# Patient Record
Sex: Male | Born: 1965 | Race: White | Hispanic: No | Smoking: Former smoker
Health system: Southern US, Community
[De-identification: ages and names within clinical notes are randomized; demographics above are authoritative.]

## PROBLEM LIST (undated history)

## (undated) DIAGNOSIS — N200 Calculus of kidney: Secondary | ICD-10-CM

## (undated) DIAGNOSIS — M069 Rheumatoid arthritis, unspecified: Secondary | ICD-10-CM

## (undated) HISTORY — DX: Rheumatoid arthritis, unspecified: M06.9

## (undated) HISTORY — PX: LITHOTRIPSY: SUR834

---

## 2003-11-16 ENCOUNTER — Emergency Department (HOSPITAL_COMMUNITY): Admission: EM | Admit: 2003-11-16 | Discharge: 2003-11-16 | Payer: Self-pay | Admitting: Emergency Medicine

## 2003-12-17 ENCOUNTER — Emergency Department (HOSPITAL_COMMUNITY): Admission: EM | Admit: 2003-12-17 | Discharge: 2003-12-18 | Payer: Self-pay | Admitting: Emergency Medicine

## 2004-07-07 ENCOUNTER — Emergency Department (HOSPITAL_COMMUNITY): Admission: EM | Admit: 2004-07-07 | Discharge: 2004-07-07 | Payer: Self-pay | Admitting: Emergency Medicine

## 2004-10-04 ENCOUNTER — Emergency Department (HOSPITAL_COMMUNITY): Admission: AC | Admit: 2004-10-04 | Discharge: 2004-10-05 | Payer: Self-pay
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
## 2008-12-03 ENCOUNTER — Emergency Department (HOSPITAL_COMMUNITY): Admission: EM | Admit: 2008-12-03 | Discharge: 2008-12-03 | Payer: Self-pay | Admitting: Emergency Medicine

## 2009-12-26 ENCOUNTER — Emergency Department (HOSPITAL_COMMUNITY): Admission: EM | Admit: 2009-12-26 | Discharge: 2009-12-27 | Payer: Self-pay | Admitting: Emergency Medicine

## 2010-11-30 ENCOUNTER — Emergency Department (HOSPITAL_COMMUNITY)
Admission: EM | Admit: 2010-11-30 | Discharge: 2010-12-01 | Disposition: A | Discharge status: Home / Self Care | Payer: Self-pay | Attending: Emergency Medicine | Admitting: Emergency Medicine

## 2010-11-30 DIAGNOSIS — R05 Cough: Secondary | ICD-10-CM | POA: Insufficient documentation

## 2010-11-30 DIAGNOSIS — K089 Disorder of teeth and supporting structures, unspecified: Secondary | ICD-10-CM | POA: Insufficient documentation

## 2010-11-30 DIAGNOSIS — R51 Headache: Secondary | ICD-10-CM | POA: Insufficient documentation

## 2010-11-30 DIAGNOSIS — R059 Cough, unspecified: Secondary | ICD-10-CM | POA: Insufficient documentation

## 2010-11-30 DIAGNOSIS — J40 Bronchitis, not specified as acute or chronic: Secondary | ICD-10-CM | POA: Insufficient documentation

## 2010-11-30 DIAGNOSIS — K029 Dental caries, unspecified: Secondary | ICD-10-CM | POA: Insufficient documentation

## 2010-11-30 DIAGNOSIS — J3489 Other specified disorders of nose and nasal sinuses: Secondary | ICD-10-CM | POA: Insufficient documentation

## 2011-12-31 ENCOUNTER — Emergency Department (HOSPITAL_COMMUNITY): Payer: Self-pay

## 2011-12-31 ENCOUNTER — Inpatient Hospital Stay (HOSPITAL_COMMUNITY)
Admission: EM | Admit: 2011-12-31 | Discharge: 2012-01-02 | DRG: 418 | Disposition: A | Discharge status: Home / Self Care | Payer: Self-pay | Attending: General Surgery | Admitting: General Surgery

## 2011-12-31 ENCOUNTER — Encounter (HOSPITAL_COMMUNITY): Payer: Self-pay | Admitting: *Deleted

## 2011-12-31 DIAGNOSIS — K8000 Calculus of gallbladder with acute cholecystitis without obstruction: Principal | ICD-10-CM | POA: Diagnosis present

## 2011-12-31 DIAGNOSIS — F17200 Nicotine dependence, unspecified, uncomplicated: Secondary | ICD-10-CM | POA: Diagnosis present

## 2011-12-31 DIAGNOSIS — K819 Cholecystitis, unspecified: Secondary | ICD-10-CM

## 2011-12-31 DIAGNOSIS — K821 Hydrops of gallbladder: Secondary | ICD-10-CM | POA: Diagnosis present

## 2011-12-31 LAB — COMPREHENSIVE METABOLIC PANEL
BUN: 11 mg/dL (ref 6–23)
CO2: 27 mEq/L (ref 19–32)
Chloride: 98 mEq/L (ref 96–112)
Creatinine, Ser: 0.74 mg/dL (ref 0.50–1.35)
GFR calc non Af Amer: >90 mL/min (ref >90)
Total Bilirubin: 0.2 mg/dL — ABNORMAL LOW (ref 0.3–1.2)

## 2011-12-31 LAB — DIFFERENTIAL
Basophils Relative: 1 % (ref 0–1)
Lymphocytes Relative: 18 % (ref 12–46)
Monocytes Absolute: 1 10*3/uL (ref 0.1–1.0)
Monocytes Relative: 10 % (ref 3–12)
Neutro Abs: 7.2 10*3/uL (ref 1.7–7.7)

## 2011-12-31 LAB — CBC
HCT: 40.1 % (ref 39.0–52.0)
Hemoglobin: 13.7 g/dL (ref 13.0–17.0)
MCHC: 34.2 g/dL (ref 30.0–36.0)
MCV: 89.9 fL (ref 78.0–100.0)

## 2011-12-31 LAB — LIPASE, BLOOD: Lipase: 27 U/L (ref 11–59)

## 2011-12-31 MED ORDER — HYDROMORPHONE HCL PF 1 MG/ML IJ SOLN
1.0000 mg | Freq: Once | INTRAMUSCULAR | Status: AC
  Administered 2011-12-31: 1 mg via INTRAVENOUS
  Filled 2011-12-31: qty 1

## 2011-12-31 MED ORDER — ONDANSETRON HCL 4 MG/2ML IJ SOLN
4.0000 mg | Freq: Once | INTRAMUSCULAR | Status: AC
  Administered 2011-12-31: 4 mg via INTRAVENOUS
  Filled 2011-12-31: qty 2

## 2011-12-31 MED ORDER — HYDROMORPHONE HCL PF 1 MG/ML IJ SOLN
2.0000 mg | INTRAMUSCULAR | Status: DC | PRN
  Administered 2011-12-31 – 2012-01-02 (×12): 2 mg via INTRAVENOUS
  Filled 2011-12-31 (×12): qty 2

## 2011-12-31 MED ORDER — KCL IN DEXTROSE-NACL 20-5-0.45 MEQ/L-%-% IV SOLN
INTRAVENOUS | Status: DC
  Administered 2011-12-31 – 2012-01-01 (×2): via INTRAVENOUS

## 2011-12-31 MED ORDER — SODIUM CHLORIDE 0.9 % IJ SOLN
INTRAMUSCULAR | Status: AC
  Administered 2012-01-01: 10 mL
  Filled 2011-12-31: qty 3

## 2011-12-31 MED ORDER — SODIUM CHLORIDE 0.9 % IV SOLN: INTRAVENOUS | Status: DC

## 2011-12-31 MED ORDER — SODIUM CHLORIDE 0.9 % IV SOLN
Freq: Once | INTRAVENOUS | Status: AC
  Administered 2011-12-31: 14:00:00 via INTRAVENOUS

## 2011-12-31 MED ORDER — PANTOPRAZOLE SODIUM 40 MG IV SOLR
40.0000 mg | Freq: Once | INTRAVENOUS | Status: AC
  Administered 2011-12-31: 40 mg via INTRAVENOUS
  Filled 2011-12-31: qty 40

## 2011-12-31 MED ORDER — ENOXAPARIN SODIUM 40 MG/0.4ML ~~LOC~~ SOLN
40.0000 mg | SUBCUTANEOUS | Status: DC
  Administered 2011-12-31: 40 mg via SUBCUTANEOUS
  Filled 2011-12-31: qty 0.4

## 2011-12-31 MED ORDER — DIPHENHYDRAMINE HCL 12.5 MG/5ML PO ELIX: 12.5000 mg | ORAL_SOLUTION | Freq: Four times a day (QID) | ORAL | Status: DC | PRN

## 2011-12-31 MED ORDER — ONDANSETRON HCL 4 MG/2ML IJ SOLN
4.0000 mg | Freq: Four times a day (QID) | INTRAMUSCULAR | Status: DC | PRN
  Administered 2012-01-01: 4 mg via INTRAVENOUS
  Filled 2011-12-31: qty 2

## 2011-12-31 MED ORDER — DIPHENHYDRAMINE HCL 50 MG/ML IJ SOLN: 12.5000 mg | Freq: Four times a day (QID) | INTRAMUSCULAR | Status: DC | PRN

## 2011-12-31 NOTE — ED Notes (Signed)
Pain in right upper quadrant radiating into right rib cage

## 2011-12-31 NOTE — H&P (Signed)
Miguel Porter is an 46 y.o. male.   Chief Complaint: Right upper quadrant abdominal pain HPI: Patient is a 46 year old white male who presented to the emergency room earlier today with right upper quadrant abdominal pain, nausea, and generalized malaise. Ultrasound of gallbladder revealed cholecystitis with cholelithiasis. Common bile duct was within normal limits. This is his first episode. Denies any fever, chills, or jaundice.  History reviewed. No pertinent past medical history.  Past Surgical History  Procedure Date  . Lithotripsy     No family history on file. Social History:  reports that he has been smoking.  He does not have any smokeless tobacco history on file. He reports that he does not drink alcohol. His drug history not on file.  Allergies: No Known Allergies  Medications Prior to Admission  Medication Dose Route Frequency Provider Last Rate Last Dose  . 0.9 %  sodium chloride infusion   Intravenous Once [employer]. Colon Branch, MD 75 mL/hr at 12/31/11 1417    . dextrose 5 % and 0.45 % NaCl with KCl 20 mEq/L infusion   Intravenous Continuous Dalia Heading, MD      . diphenhydrAMINE (BENADRYL) injection 12.5-25 mg  12.5-25 mg Intravenous Q6H PRN Dalia Heading, MD       Or  . diphenhydrAMINE (BENADRYL) 12.5 MG/5ML elixir 12.5-25 mg  12.5-25 mg Oral Q6H PRN Dalia Heading, MD      . enoxaparin (LOVENOX) injection 40 mg  40 mg Subcutaneous Q24H Dalia Heading, MD      . HYDROmorphone (DILAUDID) injection 1 mg  1 mg Intravenous Once Nicoletta Dress. Colon Branch, MD   1 mg at 12/31/11 1417  . HYDROmorphone (DILAUDID) injection 1 mg  1 mg Intravenous Once [employer]. Colon Branch, MD   1 mg at 12/31/11 1718  . HYDROmorphone (DILAUDID) injection 1 mg  1 mg Intravenous Once [employer]. Colon Branch, MD   1 mg at 12/31/11 1830  . HYDROmorphone (DILAUDID) injection 2 mg  2 mg Intravenous Q3H PRN Dalia Heading, MD      . ondansetron [facility]) injection 4 mg  4 mg Intravenous Once Nicoletta Dress. Colon Branch, MD   4 mg at 12/31/11 1417   . ondansetron (ZOFRAN) injection 4 mg  4 mg Intravenous Q6H PRN Dalia Heading, MD      . pantoprazole (PROTONIX) injection 40 mg  40 mg Intravenous Once [employer]. Colon Branch, MD   40 mg at 12/31/11 1417  . DISCONTD: 0.9 %  sodium chloride infusion   Intravenous STAT Nicoletta Dress. Colon Branch, MD       No current outpatient prescriptions on file as of 12/31/2011.    Results for orders placed during the hospital encounter of 12/31/11 (from the past 48 hour(s))  CBC     Status: Normal   Collection Time   12/31/11  2:02 PM      Component Value Range Comment   WBC 10.5  4.0 - 10.5 (K/uL)    RBC 4.46  4.22 - 5.81 (MIL/uL)    Hemoglobin 13.7  13.0 - 17.0 (g/dL)    HCT 45.4  09.8 - 11.9 (%)    MCV 89.9  78.0 - 100.0 (fL)    MCH 30.7  26.0 - 34.0 (pg)    MCHC 34.2  30.0 - 36.0 (g/dL)    RDW 14.7  82.9 - 56.2 (%)    Platelets 229  150 - 400 (K/uL)   DIFFERENTIAL     Status: Normal   Collection  Time   12/31/11  2:02 PM      Component Value Range Comment   Neutrophils Relative 69  43 - 77 (%)    Neutro Abs 7.2  1.7 - 7.7 (K/uL)    Lymphocytes Relative 18  12 - 46 (%)    Lymphs Abs 1.9  0.7 - 4.0 (K/uL)    Monocytes Relative 10  3 - 12 (%)    Monocytes Absolute 1.0  0.1 - 1.0 (K/uL)    Eosinophils Relative 3  0 - 5 (%)    Eosinophils Absolute 0.3  0.0 - 0.7 (K/uL)    Basophils Relative 1  0 - 1 (%)    Basophils Absolute 0.1  0.0 - 0.1 (K/uL)   COMPREHENSIVE METABOLIC PANEL     Status: Abnormal   Collection Time   12/31/11  2:02 PM      Component Value Range Comment   Sodium 133 (*) 135 - 145 (mEq/L)    Potassium 3.9  3.5 - 5.1 (mEq/L)    Chloride 98  96 - 112 (mEq/L)    CO2 27  19 - 32 (mEq/L)    Glucose, Bld 132 (*) 70 - 99 (mg/dL)    BUN 11  6 - 23 (mg/dL)    Creatinine, Ser 1.61  0.50 - 1.35 (mg/dL)    Calcium 9.4  8.4 - 10.5 (mg/dL)    Total Protein 7.4  6.0 - 8.3 (g/dL)    Albumin 3.6  3.5 - 5.2 (g/dL)    AST 12  0 - 37 (U/L)    ALT 9  0 - 53 (U/L)    Alkaline Phosphatase 92  39 - 117  (U/L)    Total Bilirubin 0.2 (*) 0.3 - 1.2 (mg/dL)    GFR calc non Af Amer >90  >90 (mL/min)    GFR calc Af Amer >90  >90 (mL/min)   LIPASE, BLOOD     Status: Normal   Collection Time   12/31/11  2:02 PM      Component Value Range Comment   Lipase 27  11 - 59 (U/L)    Dg Abd Acute W/chest  12/31/2011  *RADIOLOGY REPORT*  Clinical Data: Epigastric and right upper quadrant pain  ACUTE ABDOMEN SERIES (ABDOMEN 2 VIEW & CHEST 1 VIEW)  Comparison: Chest radiograph 10/04/2004  Findings: Normal heart size, mediastinal contours, and pulmonary vascularity. Lungs clear. Minimally prominent stool in right colon from mid ascending colon through proximal transverse colon with a normal amount of stool throughout remainder of colon. Overall nonobstructive bowel gas pattern without bowel wall thickening or bowel dilatation. No free intraperitoneal air. Bones unremarkable. No urinary tract calcification.  IMPRESSION: No acute abnormalities.  Original Report Authenticated By: Lollie Marrow, M.D.   US Abdomen Limited Ruq  12/31/2011  *RADIOLOGY REPORT*  Clinical Data:  Right upper quadrant abdominal pain.  LIMITED ABDOMINAL ULTRASOUND - RIGHT UPPER QUADRANT  Comparison:  None.  Findings:  Gallbladder:  Multiple echogenic stones are present within the gallbladder.  Several of these stones are large, measuring up to 2.3 cm.  The gallbladder wall is thickened with edema.  It measures up to 4.2 mm.  A+ sonographic Murphy's sign is present.  Common bile duct:  The common bile duct is upper limits of normal at to 520 mm.  Liver:  Heterogeneous echotexture is noted, likely reflecting mild fatty infiltration.  No discrete lesions are evident.  Hepatopetal flow is present in the portal vein.  IMPRESSION:  1.  Findings  compatible with cholecystitis. 2.  Probable fatty infiltration of the liver.                   Original Report Authenticated By: Jamesetta Orleans. MATTERN, M.D.    Review of Systems  Constitutional: Positive for  fever and malaise/fatigue.  HENT: Negative.   Eyes: Negative.   Respiratory: Negative.   Cardiovascular: Negative.   Gastrointestinal: Positive for nausea and abdominal pain.  Genitourinary: Negative.   Musculoskeletal: Negative.   Skin: Negative.   Neurological: Negative.   Endo/Heme/Allergies: Negative.   Psychiatric/Behavioral: Negative.     Blood pressure 133/96, pulse 89, temperature 98.4 F (36.9 C), temperature source Oral, resp. rate 20, height 6' (1.829 m), weight 98.884 kg (218 lb), SpO2 92.00%. Physical Exam  Constitutional: He is oriented to person, place, and time. He appears well-developed and well-nourished.  HENT:  Head: Normocephalic.  Eyes: Pupils are equal, round, and reactive to light. No scleral icterus.  Neck: Normal range of motion. Neck supple.  Cardiovascular: Normal rate, regular rhythm and normal heart sounds.   Respiratory: Effort normal and breath sounds normal.  GI: Soft. Bowel sounds are normal. There is tenderness. There is guarding.       Rapid quadrant abdominal pain to palpation. No rigidity noted.  Neurological: He is alert and oriented to person, place, and time.  Skin: Skin is warm and dry.  Psychiatric: He has a normal mood and affect. His behavior is normal. Judgment and thought content normal.     Assessment/Plan Impression: Cholecystitis, cholelithiasis Plan: Patient will be admitted the hospital for intravenous hydration and pain control. He was subsequently undergo a laparoscopic cholecystectomy. The risks and benefits of the procedure including bleeding, infection, hepatobiliary, the possibly of an open procedure were fully explained to the patient, gave informed consent.  Renny Gunnarson A 12/31/2011, 8:55 PM

## 2011-12-31 NOTE — ED Notes (Signed)
Pt watching tv. Rates pain 5/10. States he would like to have some more pain meds if possible. edp aware

## 2011-12-31 NOTE — ED Notes (Signed)
Per Kem Kays, RN pt's room will be ready in 10 min.

## 2011-12-31 NOTE — ED Provider Notes (Addendum)
History     CSN: [id number]  Arrival date & time 12/31/11  1255   First MD Initiated Contact with Patient 12/31/11 1308      Chief Complaint  Patient presents with  . Abdominal Pain    (Consider location/radiation/quality/duration/timing/severity/associated sxs/prior treatment) HPI Miguel Porter is a 46 y.o. male who presents to the Emergency Department complaining of sharp abdominal pain that began this morning and is located in the epigastric area and RUQ. He still has his gallbladder. He describes pain as constant with intermittent episodes of more intense pain. He ate a small bowl of oatmeal at 6:30 AM. Has had continuous pain all day. Has had mild nausea but no vomiting. Last bowel movement was yesterday and less than normal. Patient states he has a history of constipation.   History reviewed. No pertinent past medical history.  Past Surgical History  Procedure Date  . Lithotripsy     No family history on file.  History  Substance Use Topics  . Smoking status: Current Everyday Smoker -- 0.5 packs/day  . Smokeless tobacco: Not on file  . Alcohol Use: No      Review of Systems ROS: Statement: All systems negative except as marked or noted in the HPI; Constitutional: Negative for fever and chills. ; ; Eyes: Negative for eye pain, redness and discharge. ; ; ENMT: Negative for ear pain, hoarseness, nasal congestion, sinus pressure and sore throat. ; ; Cardiovascular: Negative for chest pain, palpitations, diaphoresis, dyspnea and peripheral edema. ; ; Respiratory: Negative for cough, wheezing and stridor. ; ; Gastrointestinal: Negative for  vomiting, diarrhea,  blood in stool, hematemesis, jaundice and rectal bleeding. . ; ; Genitourinary: Negative for dysuria, flank pain and hematuria. ; ; Musculoskeletal: Negative for back pain and neck pain. Negative for swelling and trauma.; ; Skin: Negative for pruritus, rash, abrasions, blisters, bruising and skin lesion.; ; Neuro: Negative  for headache, lightheadedness and neck stiffness. Negative for weakness, altered level of consciousness , altered mental status, extremity weakness, paresthesias, involuntary movement, seizure and syncope.     Allergies  Review of patient's allergies indicates no known allergies.  Home Medications  No current outpatient prescriptions on file.  BP 162/106  Pulse 81  Temp(Src) 97.7 F (36.5 C) (Oral)  Resp 20  Ht 6' (1.829 m)  Wt 218 lb (98.884 kg)  BMI 29.57 kg/m2  SpO2 98%  Physical Exam Physical examination:  Nursing notes reviewed; Vital signs and O2 SAT reviewed;  Constitutional: Well developed, Well nourished, Well hydrated, In no acute distress; Head:  Normocephalic, atraumatic; Eyes: EOMI, PERRL, No scleral icterus; ENMT: Mouth and pharynx normal, Mucous membranes moist; Neck: Supple, Full range of motion, No lymphadenopathy; Cardiovascular: Regular rate and rhythm, No murmur, rub, or gallop; Respiratory: Breath sounds clear & equal bilaterally, No rales, rhonchi, wheezes, or rub, Normal respiratory effort/excursion; Chest: Nontender, Movement normal; Abdomen: Soft, tenderness to the epigastric area and RUQ, Nondistended, Normal bowel sounds; Genitourinary: No CVA tenderness; Extremities: Pulses normal, No tenderness, No edema, No calf edema or asymmetry.; Neuro: AA&Ox3, Major CN grossly intact.  No gross focal motor or sensory deficits in extremities.; Skin: Color normal, Warm, Dry  ED Course  Procedures (including critical care time)  Results for orders placed during the hospital encounter of 12/31/11  CBC      Component Value Range   WBC 10.5  4.0 - 10.5 (K/uL)   RBC 4.46  4.22 - 5.81 (MIL/uL)   Hemoglobin 13.7  13.0 - 17.0 (g/dL)  HCT 40.1  39.0 - 52.0 (%)   MCV 89.9  78.0 - 100.0 (fL)   MCH 30.7  26.0 - 34.0 (pg)   MCHC 34.2  30.0 - 36.0 (g/dL)   RDW 91.4  78.2 - 95.6 (%)   Platelets 229  150 - 400 (K/uL)  DIFFERENTIAL      Component Value Range   Neutrophils  Relative 69  43 - 77 (%)   Neutro Abs 7.2  1.7 - 7.7 (K/uL)   Lymphocytes Relative 18  12 - 46 (%)   Lymphs Abs 1.9  0.7 - 4.0 (K/uL)   Monocytes Relative 10  3 - 12 (%)   Monocytes Absolute 1.0  0.1 - 1.0 (K/uL)   Eosinophils Relative 3  0 - 5 (%)   Eosinophils Absolute 0.3  0.0 - 0.7 (K/uL)   Basophils Relative 1  0 - 1 (%)   Basophils Absolute 0.1  0.0 - 0.1 (K/uL)  COMPREHENSIVE METABOLIC PANEL      Component Value Range   Sodium 133 (*) 135 - 145 (mEq/L)   Potassium 3.9  3.5 - 5.1 (mEq/L)   Chloride 98  96 - 112 (mEq/L)   CO2 27  19 - 32 (mEq/L)   Glucose, Bld 132 (*) 70 - 99 (mg/dL)   BUN 11  6 - 23 (mg/dL)   Creatinine, Ser 2.13  0.50 - 1.35 (mg/dL)   Calcium 9.4  8.4 - 08.6 (mg/dL)   Total Protein 7.4  6.0 - 8.3 (g/dL)   Albumin 3.6  3.5 - 5.2 (g/dL)   AST 12  0 - 37 (U/L)   ALT 9  0 - 53 (U/L)   Alkaline Phosphatase 92  39 - 117 (U/L)   Total Bilirubin 0.2 (*) 0.3 - 1.2 (mg/dL)   GFR calc non Af Amer >90  >90 (mL/min)   GFR calc Af Amer >90  >90 (mL/min)  LIPASE, BLOOD      Component Value Range   Lipase 27  11 - 59 (U/L)     Date: 12/31/2011  1304  Rate:86  Rhythm: normal sinus rhythm  QRS Axis: normal  Intervals: normal  ST/T Wave abnormalities: normal  Conduction Disutrbances: none  Narrative Interpretation: unremarkable  Dg Abd Acute W/chest  12/31/2011  *RADIOLOGY REPORT*  Clinical Data: Epigastric and right upper quadrant pain  ACUTE ABDOMEN SERIES (ABDOMEN 2 VIEW & CHEST 1 VIEW)  Comparison: Chest radiograph 10/04/2004  Findings: Normal heart size, mediastinal contours, and pulmonary vascularity. Lungs clear. Minimally prominent stool in right colon from mid ascending colon through proximal transverse colon with a normal amount of stool throughout remainder of colon. Overall nonobstructive bowel gas pattern without bowel wall thickening or bowel dilatation. No free intraperitoneal air. Bones unremarkable. No urinary tract calcification.  IMPRESSION: No acute  abnormalities.  Original Report Authenticated By: Lollie Marrow, M.D.   US Abdomen Limited Ruq  12/31/2011  *RADIOLOGY REPORT*  Clinical Data:  Right upper quadrant abdominal pain.  LIMITED ABDOMINAL ULTRASOUND - RIGHT UPPER QUADRANT  Comparison:  None.  Findings:  Gallbladder:  Multiple echogenic stones are present within the gallbladder.  Several of these stones are large, measuring up to 2.3 cm.  The gallbladder wall is thickened with edema.  It measures up to 4.2 mm.  A+ sonographic Murphy's sign is present.  Common bile duct:  The common bile duct is upper limits of normal at to 520 mm.  Liver:  Heterogeneous echotexture is noted, likely reflecting mild fatty infiltration.  No discrete lesions are evident.  Hepatopetal flow is present in the portal vein.  IMPRESSION:  1.  Findings compatible with cholecystitis. 2.  Probable fatty infiltration of the liver.                   Original Report Authenticated By: Jamesetta Orleans. MATTERN, M.D.    Date: 12/31/2011  12/31/2011  Rate:75  Rhythm: normal sinus rhythm  QRS Axis: normal  Intervals: normal  ST/T Wave abnormalities: normal  Conduction Disutrbances: none  Narrative Interpretation: unremarkable  0512  T/C to Dr. Lovell Sheehan,  General surgeon. Case discussed, including:  HPI, pertinent PM/SHx, VS/PE, dx testing, ED course and treatment.  Agreeable to admission.  Requests to write temporary orders, med surg bed.   MDM  Patient with right upper quadrant and epigastric pain since this morning. Labs are normal. Abdomen series is unremarkable. Ultrasound with findings compatible with cholecystitis. The general surgeon, Dr. Lovell Sheehan. He would admit the patient for cholecystectomy in the morning.Pt stable in ED with no significant deterioration in condition.The patient appears reasonably stabilized for admission considering the current resources, flow, and capabilities available in the ED at this time, and I doubt any other [facility] requiring further screening  and/or treatment in the ED prior to admission.  MDM Reviewed: nursing note and vitals Interpretation: labs, ECG, x-ray and ultrasound           Nicoletta Dress. Colon Branch, MD 12/31/11 1756  Nicoletta Dress. Colon Branch, MD 12/31/11 2127

## 2012-01-01 ENCOUNTER — Encounter (HOSPITAL_COMMUNITY): Payer: Self-pay | Admitting: *Deleted

## 2012-01-01 ENCOUNTER — Encounter (HOSPITAL_COMMUNITY)
Admission: EM | Disposition: A | Discharge status: Home / Self Care | Payer: Self-pay | Source: Home / Self Care | Attending: General Surgery

## 2012-01-01 ENCOUNTER — Encounter (HOSPITAL_COMMUNITY): Payer: Self-pay | Admitting: Anesthesiology

## 2012-01-01 ENCOUNTER — Inpatient Hospital Stay (HOSPITAL_COMMUNITY): Payer: Self-pay | Admitting: Anesthesiology

## 2012-01-01 HISTORY — PX: CHOLECYSTECTOMY: SHX55

## 2012-01-01 LAB — SURGICAL PCR SCREEN: Staphylococcus aureus: NEGATIVE

## 2012-01-01 SURGERY — LAPAROSCOPIC CHOLECYSTECTOMY
Anesthesia: General | Site: Abdomen | Wound class: Contaminated

## 2012-01-01 MED ORDER — ENOXAPARIN SODIUM 40 MG/0.4ML ~~LOC~~ SOLN
40.0000 mg | Freq: Once | SUBCUTANEOUS | Status: AC
  Administered 2012-01-01: 40 mg via SUBCUTANEOUS

## 2012-01-01 MED ORDER — FENTANYL CITRATE 0.05 MG/ML IJ SOLN
INTRAMUSCULAR | Status: AC
  Administered 2012-01-01: 50 ug via INTRAVENOUS
  Filled 2012-01-01: qty 2

## 2012-01-01 MED ORDER — GLYCOPYRROLATE 0.2 MG/ML IJ SOLN
INTRAMUSCULAR | Status: DC | PRN
  Administered 2012-01-01: 0.4 mg via INTRAVENOUS

## 2012-01-01 MED ORDER — ACETAMINOPHEN 10 MG/ML IV SOLN
1000.0000 mg | Freq: Four times a day (QID) | INTRAVENOUS | Status: DC
  Administered 2012-01-01 – 2012-01-02 (×3): 1000 mg via INTRAVENOUS
  Filled 2012-01-01 (×3): qty 100

## 2012-01-01 MED ORDER — ROCURONIUM BROMIDE 50 MG/5ML IV SOLN
INTRAVENOUS | Status: AC
  Filled 2012-01-01: qty 1

## 2012-01-01 MED ORDER — FENTANYL CITRATE 0.05 MG/ML IJ SOLN
INTRAMUSCULAR | Status: AC
  Administered 2012-01-01: 50 ug via INTRAVENOUS
  Filled 2012-01-01: qty 5

## 2012-01-01 MED ORDER — GLYCOPYRROLATE 0.2 MG/ML IJ SOLN
INTRAMUSCULAR | Status: AC
  Administered 2012-01-01: 0.2 mg via INTRAVENOUS
  Filled 2012-01-01: qty 1

## 2012-01-01 MED ORDER — ONDANSETRON HCL 4 MG/2ML IJ SOLN
4.0000 mg | Freq: Once | INTRAMUSCULAR | Status: AC
  Administered 2012-01-01: 4 mg via INTRAVENOUS

## 2012-01-01 MED ORDER — MIDAZOLAM HCL 2 MG/2ML IJ SOLN
INTRAMUSCULAR | Status: AC
  Administered 2012-01-01: 2 mg via INTRAVENOUS
  Filled 2012-01-01: qty 2

## 2012-01-01 MED ORDER — ENOXAPARIN SODIUM 40 MG/0.4ML ~~LOC~~ SOLN
SUBCUTANEOUS | Status: AC
  Filled 2012-01-01: qty 0.4

## 2012-01-01 MED ORDER — GLYCOPYRROLATE 0.2 MG/ML IJ SOLN
0.2000 mg | Freq: Once | INTRAMUSCULAR | Status: AC
  Administered 2012-01-01: 0.2 mg via INTRAVENOUS

## 2012-01-01 MED ORDER — GLYCOPYRROLATE 0.2 MG/ML IJ SOLN
INTRAMUSCULAR | Status: AC
  Filled 2012-01-01: qty 1

## 2012-01-01 MED ORDER — SODIUM CHLORIDE 0.9 % IV SOLN
3.0000 g | Freq: Four times a day (QID) | INTRAVENOUS | Status: AC
  Administered 2012-01-01 – 2012-01-02 (×2): 3 g via INTRAVENOUS
  Filled 2012-01-01 (×3): qty 3

## 2012-01-01 MED ORDER — MIDAZOLAM HCL 2 MG/2ML IJ SOLN
INTRAMUSCULAR | Status: AC
  Filled 2012-01-01: qty 2

## 2012-01-01 MED ORDER — FENTANYL CITRATE 0.05 MG/ML IJ SOLN
INTRAMUSCULAR | Status: DC | PRN
  Administered 2012-01-01: 50 ug via INTRAVENOUS
  Administered 2012-01-01: 100 ug via INTRAVENOUS
  Administered 2012-01-01 (×2): 50 ug via INTRAVENOUS

## 2012-01-01 MED ORDER — ACETAMINOPHEN 325 MG PO TABS: 325.0000 mg | ORAL_TABLET | ORAL | Status: DC | PRN

## 2012-01-01 MED ORDER — SODIUM CHLORIDE 0.9 % IR SOLN
Status: DC | PRN
  Administered 2012-01-01: 3000 mL

## 2012-01-01 MED ORDER — MIDAZOLAM HCL 2 MG/2ML IJ SOLN
1.0000 mg | INTRAMUSCULAR | Status: DC | PRN
  Administered 2012-01-01 (×2): 2 mg via INTRAVENOUS

## 2012-01-01 MED ORDER — ACETAMINOPHEN 10 MG/ML IV SOLN
INTRAVENOUS | Status: AC
  Administered 2012-01-01: 1000 mg
  Filled 2012-01-01: qty 100

## 2012-01-01 MED ORDER — SODIUM CHLORIDE 0.9 % IR SOLN
Status: DC | PRN
  Administered 2012-01-01: 1000 mL

## 2012-01-01 MED ORDER — PROPOFOL 10 MG/ML IV EMUL
INTRAVENOUS | Status: DC | PRN
  Administered 2012-01-01: 150 mg via INTRAVENOUS
  Administered 2012-01-01: 50 mg via INTRAVENOUS

## 2012-01-01 MED ORDER — LIDOCAINE HCL (CARDIAC) 10 MG/ML IV SOLN
INTRAVENOUS | Status: DC | PRN
  Administered 2012-01-01: 10 mg via INTRAVENOUS

## 2012-01-01 MED ORDER — CEFAZOLIN SODIUM-DEXTROSE 2-3 GM-% IV SOLR
INTRAVENOUS | Status: AC
  Filled 2012-01-01: qty 50

## 2012-01-01 MED ORDER — FENTANYL CITRATE 0.05 MG/ML IJ SOLN
25.0000 ug | INTRAMUSCULAR | Status: DC | PRN
  Administered 2012-01-01 (×4): 50 ug via INTRAVENOUS

## 2012-01-01 MED ORDER — NEOSTIGMINE METHYLSULFATE 1 MG/ML IJ SOLN
INTRAMUSCULAR | Status: DC | PRN
  Administered 2012-01-01: 2 mg via INTRAVENOUS

## 2012-01-01 MED ORDER — CEFAZOLIN SODIUM-DEXTROSE 2-3 GM-% IV SOLR
2.0000 g | INTRAVENOUS | Status: AC
  Administered 2012-01-01: 2 g via INTRAVENOUS

## 2012-01-01 MED ORDER — ONDANSETRON HCL 4 MG/2ML IJ SOLN
INTRAMUSCULAR | Status: AC
  Filled 2012-01-01: qty 2

## 2012-01-01 MED ORDER — BUPIVACAINE HCL 0.5 % IJ SOLN
INTRAMUSCULAR | Status: DC | PRN
  Administered 2012-01-01: 15 mL

## 2012-01-01 MED ORDER — ROCURONIUM BROMIDE 100 MG/10ML IV SOLN
INTRAVENOUS | Status: DC | PRN
  Administered 2012-01-01: 30 mg via INTRAVENOUS
  Administered 2012-01-01: 5 mg via INTRAVENOUS
  Administered 2012-01-01: 10 mg via INTRAVENOUS

## 2012-01-01 MED ORDER — SODIUM CHLORIDE 0.9 % IJ SOLN
INTRAMUSCULAR | Status: AC
  Administered 2012-01-01: 10 mL
  Filled 2012-01-01: qty 3

## 2012-01-01 MED ORDER — ONDANSETRON HCL 4 MG/2ML IJ SOLN: 4.0000 mg | Freq: Once | INTRAMUSCULAR | Status: DC | PRN

## 2012-01-01 MED ORDER — PROPOFOL 10 MG/ML IV EMUL
INTRAVENOUS | Status: AC
  Filled 2012-01-01: qty 20

## 2012-01-01 MED ORDER — BUPIVACAINE HCL (PF) 0.5 % IJ SOLN
INTRAMUSCULAR | Status: AC
  Filled 2012-01-01: qty 30

## 2012-01-01 MED ORDER — LACTATED RINGERS IV SOLN
INTRAVENOUS | Status: DC
  Administered 2012-01-01: 1000 mL via INTRAVENOUS

## 2012-01-01 MED ORDER — LACTATED RINGERS IV SOLN
INTRAVENOUS | Status: DC
  Administered 2012-01-02: 03:00:00 via INTRAVENOUS

## 2012-01-01 MED ORDER — HEMOSTATIC AGENTS (NO CHARGE) OPTIME
TOPICAL | Status: DC | PRN
  Administered 2012-01-01: 1 via TOPICAL

## 2012-01-01 SURGICAL SUPPLY — 36 items
APPLIER CLIP ROT 10 11.4 M/L (STAPLE) ×2
APR CLP MED LRG 11.4X10 (STAPLE) ×1
BAG HAMPER (MISCELLANEOUS) ×2 IMPLANT
BAG SPEC RTRVL LRG 6X4 10 (ENDOMECHANICALS) ×1
CLIP APPLIE ROT 10 11.4 M/L (STAPLE) ×1 IMPLANT
CLOTH BEACON ORANGE TIMEOUT ST (SAFETY) ×2 IMPLANT
COVER SURGICAL LIGHT HANDLE (MISCELLANEOUS) ×4 IMPLANT
CUTTER LINEAR ENDO 35 ETS TH (STAPLE) ×1 IMPLANT
DECANTER SPIKE VIAL GLASS SM (MISCELLANEOUS) ×2 IMPLANT
DURAPREP 26ML APPLICATOR (WOUND CARE) ×2 IMPLANT
ELECT REM PT RETURN 9FT ADLT (ELECTROSURGICAL) ×2
ELECTRODE REM PT RTRN 9FT ADLT (ELECTROSURGICAL) ×1 IMPLANT
FILTER SMOKE EVAC LAPAROSHD (FILTER) ×2 IMPLANT
FORMALIN 10 PREFIL 120ML (MISCELLANEOUS) ×2 IMPLANT
GLOVE BIO SURGEON STRL SZ7.5 (GLOVE) ×2 IMPLANT
GLOVE BIOGEL PI IND STRL 7.0 (GLOVE) IMPLANT
GLOVE BIOGEL PI INDICATOR 7.0 (GLOVE) ×2
GLOVE ECLIPSE 6.5 STRL STRAW (GLOVE) ×2 IMPLANT
GOWN STRL REIN XL XLG (GOWN DISPOSABLE) ×6 IMPLANT
HEMOSTAT SNOW SURGICEL 2X4 (HEMOSTASIS) ×1 IMPLANT
INST SET LAPROSCOPIC AP (KITS) ×2 IMPLANT
IV NS IRRIG 3000ML ARTHROMATIC (IV SOLUTION) ×1 IMPLANT
KIT ROOM TURNOVER APOR (KITS) ×2 IMPLANT
KIT TROCAR LAP CHOLE (TROCAR) ×2 IMPLANT
MANIFOLD NEPTUNE II (INSTRUMENTS) ×2 IMPLANT
NS IRRIG 1000ML POUR BTL (IV SOLUTION) ×2 IMPLANT
PACK LAP CHOLE LZT030E (CUSTOM PROCEDURE TRAY) ×2 IMPLANT
PAD ARMBOARD 7.5X6 YLW CONV (MISCELLANEOUS) ×2 IMPLANT
POUCH SPECIMEN RETRIEVAL 10MM (ENDOMECHANICALS) ×2 IMPLANT
SET BASIN LINEN APH (SET/KITS/TRAYS/PACK) ×2 IMPLANT
SET TUBE IRRIG SUCTION NO TIP (IRRIGATION / IRRIGATOR) ×1 IMPLANT
SPONGE GAUZE 2X2 8PLY STRL LF (GAUZE/BANDAGES/DRESSINGS) ×8 IMPLANT
STAPLER VISISTAT (STAPLE) ×2 IMPLANT
SUT VICRYL 0 UR6 27IN ABS (SUTURE) ×2 IMPLANT
WARMER LAPAROSCOPE (MISCELLANEOUS) ×2 IMPLANT
YANKAUER SUCT 12FT TUBE ARGYLE (SUCTIONS) ×2 IMPLANT

## 2012-01-01 NOTE — Op Note (Signed)
Patient:  Miguel Porter  DOB:  1966-03-22  MRN:  [id number]   Preop Diagnosis:  Cholecystitis, cholelithiasis  Postop Diagnosis:  Same, hydrops of gallbladder  Procedure:  Laparoscopic cholecystectomy  Surgeon:  Franky Macho, M.D.  Anes:  General endotracheal  Indications:  Patient is a 46 year old white male presents with acute onset of right upper quadrant abdominal pain. He is noted to have cholecystitis with cholelithiasis. The risks and benefits of the procedure including bleeding, infection, hepatobiliary injury, the possibly of an open procedure were fully explained to the patient, gave informed consent.  Procedure note:  Patient is placed the supine position. After induction of general endotracheal anesthesia, the abdomen was prepped and draped using the usual sterile technique with DuraPrep. Surgical site confirmation was performed.  An infraumbilical incision was made down to the fascia. A Veress needle was introduced into the abdominal cavity and confirmation of placement was done using the saline drop test. The abdomen was then insufflated to 16 mm mercury pressure. An 11 mm trocar was introduced into the abdominal cavity under direct visualization without difficulty. The patient was placed in reverse Trendelenburg position and additional 11 mm trocar was placed the epigastric region 5 mm trochars placed were upper quadrant and right flank regions. Liver was inspected and noted within normal limits. The gallbladder was noted to be distended, tense, with a thickened gallbladder wall. An incision was made into the anterior portion of the gallbladder in order to relieve the pressure. Hydrops of the gallbladder was found. The gallbladder was then retracted superior laterally. The dissection was begun around the infundibulum of the gallbladder. The cystic duct was first identified. Its junction to the infundibulum flow identified. A vascular Endo GIA was placed across the cystic duct and  fired. The cystic artery was ligated and divided using clips. The gallbladder was then freed away from the gallbladder fossa using Bovie electrocautery. The gallbladder was delivered through the epigastric trocar site using an Endo Catch bag. The gallbladder fossa was inspected and no bile leakage was noted. Surgicel is placed the gallbladder fossa. The right upper quadrant was copiously are irrigated normal saline. All fluid and air were then evacuated from the abdominal cavity prior to removal of the trochars.  All wounds were gave normal saline. All wounds were injected with 0.5% Sensorcaine. The infraumbilical fashion as well as epigastric fascia reapproximated using 0 Vicryl interrupted sutures. All skin incisions were closed using staples. Betadine ointment after dressings were applied.  All tape and needle counts were correct at the end of the procedure. The patient was extubated in the operating room and went back to recovery awake in stable condition.  Complications:  None  EBL:  50 cc  Specimen:  Gallbladder

## 2012-01-01 NOTE — Progress Notes (Signed)
Signed consent in chart

## 2012-01-01 NOTE — Anesthesia Procedure Notes (Signed)
Procedure Name: Intubation Date/Time: 01/01/2012 3:19 PM Performed by: Franco Nones Pre-anesthesia Checklist: Patient identified, Patient being monitored, Timeout performed, Emergency Drugs available and Suction available Patient Re-evaluated:Patient Re-evaluated prior to inductionOxygen Delivery Method: Circle System Utilized Preoxygenation: Pre-oxygenation with 100% oxygen Intubation Type: IV induction Ventilation: Mask ventilation without difficulty Laryngoscope Size: Miller and 2 Grade View: Grade I Tube type: Oral Tube size: 7.0 mm Number of attempts: 1 Airway Equipment and Method: stylet Placement Confirmation: ETT inserted through vocal cords under direct vision,  positive ETCO2 and breath sounds checked- equal and bilateral Secured at: 21 cm Tube secured with: Tape Dental Injury: Teeth and Oropharynx as per pre-operative assessment

## 2012-01-01 NOTE — Transfer of Care (Signed)
Immediate Anesthesia Transfer of Care Note  Patient: Miguel Porter  Procedure(s) Performed: Procedure(s) (LRB): LAPAROSCOPIC CHOLECYSTECTOMY (N/A)  Patient Location: PACU  Anesthesia Type: General  Level of Consciousness: awake  Airway & Oxygen Therapy: Patient Spontanous Breathing and non-rebreather face mask  Post-op Assessment: Report given to PACU RN, Post -op Vital signs reviewed and stable and Patient moving all extremities  Post vital signs: Reviewed and stable  Complications: No apparent anesthesia complications

## 2012-01-01 NOTE — Progress Notes (Signed)
   CARE MANAGEMENT NOTE 01/01/2012  Patient:  Miguel Porter, Miguel Porter   Account Number:  [id number]  Date Initiated:  01/01/2012  Documentation initiated by:  Sharrie Rothman  Subjective/Objective Assessment:   Pt admitted from home with right flank pain and cholecystitis, cholelithiasis. Pt live with girlfriend and is independent with ADL's PTA.     Action/Plan:   [name] notified of pts not having any insurance. May need to help with medications at discharge.   Anticipated DC Date:  01/07/2012   Anticipated DC Plan:  HOME/SELF CARE  In-house referral  Financial Counselor      DC Planning Services  CM consult      Choice offered to / List presented to:             Status of service:  In process, will continue to follow Medicare Important Message given?   (If response is "NO", the following Medicare IM given date fields will be blank) Date Medicare IM given:   Date Additional Medicare IM given:    Discharge Disposition:  HOME/SELF CARE  Per UR Regulation:    If discussed at Long Length of Stay Meetings, dates discussed:    Comments:  01/01/12 1352 Arlyss Queen, RN BSN CM Pt in surgery at the time of this note. If pt needs assistance with medications the house supervisior will be able to give pt a prescription voucher at discharge.

## 2012-01-01 NOTE — Anesthesia Preprocedure Evaluation (Signed)
Anesthesia Evaluation  Patient identified by MRN, date of birth, ID band Patient awake    Reviewed: Allergy & Precautions, H&P , NPO status , Patient's Chart, lab work & pertinent test results  Airway Mallampati: I TM Distance: >3 FB Neck ROM: Full    Dental No notable dental hx.    Pulmonary Current Smoker,    Pulmonary exam normal       Cardiovascular negative cardio ROS  Rhythm:Regular Rate:Normal     Neuro/Psych negative neurological ROS  negative psych ROS   GI/Hepatic negative GI ROS, Neg liver ROS,   Endo/Other    Renal/GU negative Renal ROS     Musculoskeletal negative musculoskeletal ROS (+)   Abdominal Normal abdominal exam  (+)   Peds  Hematology negative hematology ROS (+)   Anesthesia Other Findings   Reproductive/Obstetrics                           Anesthesia Physical Anesthesia Plan  ASA: II  Anesthesia Plan: General   Post-op Pain Management:    Induction: Intravenous  Airway Management Planned: Oral ETT  Additional Equipment:   Intra-op Plan:   Post-operative Plan: Extubation in OR  Informed Consent: I have reviewed the patients History and Physical, chart, labs and discussed the procedure including the risks, benefits and alternatives for the proposed anesthesia with the patient or authorized representative who has indicated his/her understanding and acceptance.     Plan Discussed with: CRNA  Anesthesia Plan Comments:         Anesthesia Quick Evaluation

## 2012-01-01 NOTE — Anesthesia Postprocedure Evaluation (Signed)
Anesthesia Post Note  Patient: Miguel Porter  Procedure(s) Performed: Procedure(s) (LRB): LAPAROSCOPIC CHOLECYSTECTOMY (N/A)  Anesthesia type: General  Patient location: PACU  Post pain: Pain level controlled  Post assessment: Post-op Vital signs reviewed, Patient's Cardiovascular Status Stable, Respiratory Function Stable, Patent Airway, No signs of Nausea or vomiting and Pain level controlled  Last Vitals:  Filed Vitals:   01/01/12 1654  BP:   Pulse: 83  Temp:   Resp: 15    Post vital signs: Reviewed and stable  Level of consciousness: awake and alert   Complications: No apparent anesthesia complications  B/P 136/79 Hr  82  R 18 SAT  98%

## 2012-01-01 NOTE — Progress Notes (Signed)
Received report from Elisa.

## 2012-01-02 LAB — BASIC METABOLIC PANEL
BUN: 6 mg/dL (ref 6–23)
Calcium: 9.2 mg/dL (ref 8.4–10.5)
Creatinine, Ser: 0.84 mg/dL (ref 0.50–1.35)
GFR calc Af Amer: >90 mL/min (ref >90)
GFR calc non Af Amer: >90 mL/min (ref >90)
Potassium: 4 mEq/L (ref 3.5–5.1)

## 2012-01-02 LAB — CBC
Hemoglobin: 12.5 g/dL — ABNORMAL LOW (ref 13.0–17.0)
MCHC: 33.2 g/dL (ref 30.0–36.0)
Platelets: 222 10*3/uL (ref 150–400)
RDW: 13 % (ref 11.5–15.5)

## 2012-01-02 LAB — HEPATIC FUNCTION PANEL
Albumin: 3.2 g/dL — ABNORMAL LOW (ref 3.5–5.2)
Indirect Bilirubin: 0.3 mg/dL (ref 0.3–0.9)
Total Protein: 6.9 g/dL (ref 6.0–8.3)

## 2012-01-02 MED ORDER — CIPROFLOXACIN HCL 500 MG PO TABS: 500.0000 mg | ORAL_TABLET | Freq: Two times a day (BID) | ORAL | Status: AC

## 2012-01-02 MED ORDER — SODIUM CHLORIDE 0.9 % IJ SOLN
INTRAMUSCULAR | Status: AC
  Administered 2012-01-02: 10 mL
  Filled 2012-01-02: qty 3

## 2012-01-02 MED ORDER — OXYCODONE-ACETAMINOPHEN 7.5-325 MG PO TABS: 1.0000 | ORAL_TABLET | ORAL | Status: AC | PRN

## 2012-01-02 NOTE — Discharge Summary (Signed)
Physician Discharge Summary  Patient ID: Miguel Porter MRN: [id number] DOB/AGE: [birth year] 46 y.o.  Admit date: 12/31/2011 Discharge date: 01/02/2012  Admission Diagnoses: Cholecystitis, cholelithiasis Discharge Diagnoses: Same, hydrops of gallbladder Active Problems:  * No active hospital problems. *    Discharged Condition: good  Hospital Course: Patient is a 46 year old white male who presented to [facility] emergency room with upper quadrant abdominal pain. He was found to have acute cholecystitis secondary to cholelithiasis. He was admitted by surgery. He subsequently underwent laparoscopic cholecystectomy on 01/01/2012. He tolerated the procedure well. His postoperative course has been unremarkable. His diet was advanced at difficulty.  The patient is being discharged home in good improving condition.  Treatments: surgery: Laparoscopic cholecystectomy on 01/01/2012  Discharge Exam: Blood pressure 133/80, pulse 86, temperature 98.1 F (36.7 C), temperature source Oral, resp. rate 18, height 6' (1.829 m), weight 98.884 kg (218 lb), SpO2 94.00%. General appearance: alert, cooperative and no distress Resp: clear to auscultation bilaterally Cardio: regular rate and rhythm, S1, S2 normal, no murmur, click, rub or gallop GI: Soft, flat. Incisions healing well.  Disposition: 01-Home or Self Care   Medication List  As of 01/02/2012 10:47 AM   TAKE these medications         ciprofloxacin 500 MG tablet   Commonly known as: CIPRO   Take 1 tablet (500 mg total) by mouth 2 (two) times daily.      oxyCODONE-acetaminophen 7.5-325 MG per tablet   Commonly known as: PERCOCET   Take 1-2 tablets by mouth every 4 (four) hours as needed for pain.           Follow-up Information    Follow up with Dalia Heading, MD. Schedule an appointment as soon as possible for a visit on 01/12/2012.   Contact information:   [address] [place]  [address] [phone]          Signed: Dalia Heading 01/02/2012, 10:47 AM

## 2012-01-02 NOTE — Progress Notes (Signed)
Discharge instructions reviewed with patient, patient voiced understanding. Discharge instructions and prescriptions given to patient. given to patient in stable condition and waiting on his ride. Will continue to monitor.

## 2012-01-02 NOTE — Progress Notes (Signed)
Patient in stable condition and transported out by tech.  

## 2012-01-02 NOTE — Anesthesia Postprocedure Evaluation (Signed)
Pt.discharged. No apparent anesthesia complications.

## 2012-01-02 NOTE — Discharge Instructions (Signed)
Laparoscopic Cholecystectomy Care After Refer to this sheet in the next few weeks. These instructions provide you with information on caring for yourself after your procedure. Your caregiver may also give you more specific instructions. Your treatment has been planned according to current medical practices, but problems sometimes occur. Call your caregiver if you have any problems or questions after your procedure. HOME CARE INSTRUCTIONS   Change bandages (dressings) as directed by your caregiver.   Keep the wound dry and clean. The wound may be washed gently with soap and water. Gently blot or dab the area dry.   Do not take baths or use swimming pools or hot tubs for 10 days, or as instructed by your caregiver.   Only take over-the-counter or prescription medicines for pain, discomfort, or fever as directed by your caregiver.   Continue your normal diet as directed by your caregiver.   Do not lift anything heavier than 25 pounds (11.5 kg), or as directed by your caregiver.   Do not play contact sports for 1 week, or as directed by your caregiver.  SEEK MEDICAL CARE IF:   There is redness, swelling, or increasing pain in the wound.   You notice yellowish-white fluid (pus) coming from the wound.   There is drainage from the wound that lasts longer than 1 day.   There is a bad smell coming from the wound or dressing.   The surgical cut (incision) breaks open.  SEEK IMMEDIATE MEDICAL CARE IF:   You develop a rash.   You have difficulty breathing.   You develop chest pain.   You develop any reaction or side effects to medicines given.   You have a fever.   You have increasing pain in the shoulders (shoulder strap areas).   You have dizzy episodes or faint while standing.   You develop severe abdominal pain.   You feel sick to your stomach (nauseous) or throw up (vomit) and this lasts for more than 1 day.  MAKE SURE YOU:   Understand these instructions.   Will watch  your condition.   Will get help right away if you are not doing well or get worse.  Document Released: 08/31/2005 Document Revised: 08/20/2011 Document Reviewed: 02/13/2011 [employer] Patient Information 2012 [employer]. 

## 2012-01-02 NOTE — Addendum Note (Signed)
Addendum  created 01/02/12 1421 by Franco Nones, CRNA   Modules edited:Notes Section

## 2012-01-05 ENCOUNTER — Encounter (HOSPITAL_COMMUNITY): Payer: Self-pay | Admitting: General Surgery

## 2013-05-05 ENCOUNTER — Emergency Department (HOSPITAL_COMMUNITY)
Admission: EM | Admit: 2013-05-05 | Discharge: 2013-05-05 | Disposition: A | Discharge status: Home / Self Care | Payer: Self-pay | Attending: Emergency Medicine | Admitting: Emergency Medicine

## 2013-05-05 ENCOUNTER — Emergency Department (HOSPITAL_COMMUNITY): Payer: Self-pay

## 2013-05-05 ENCOUNTER — Encounter (HOSPITAL_COMMUNITY): Payer: Self-pay | Admitting: *Deleted

## 2013-05-05 DIAGNOSIS — M79609 Pain in unspecified limb: Secondary | ICD-10-CM | POA: Insufficient documentation

## 2013-05-05 DIAGNOSIS — M2540 Effusion, unspecified joint: Secondary | ICD-10-CM | POA: Insufficient documentation

## 2013-05-05 DIAGNOSIS — M25541 Pain in joints of right hand: Secondary | ICD-10-CM

## 2013-05-05 DIAGNOSIS — Z87442 Personal history of urinary calculi: Secondary | ICD-10-CM | POA: Insufficient documentation

## 2013-05-05 DIAGNOSIS — M2550 Pain in unspecified joint: Secondary | ICD-10-CM | POA: Insufficient documentation

## 2013-05-05 DIAGNOSIS — M79672 Pain in left foot: Secondary | ICD-10-CM

## 2013-05-05 DIAGNOSIS — F17200 Nicotine dependence, unspecified, uncomplicated: Secondary | ICD-10-CM | POA: Insufficient documentation

## 2013-05-05 DIAGNOSIS — M25549 Pain in joints of unspecified hand: Secondary | ICD-10-CM | POA: Insufficient documentation

## 2013-05-05 HISTORY — DX: Calculus of kidney: N20.0

## 2013-05-05 MED ORDER — DICLOFENAC SODIUM 75 MG PO TBEC: 75.0000 mg | DELAYED_RELEASE_TABLET | Freq: Two times a day (BID) | ORAL | Status: DC

## 2013-05-05 MED ORDER — HYDROCODONE-ACETAMINOPHEN 5-325 MG PO TABS: ORAL_TABLET | ORAL | Status: DC

## 2013-05-05 NOTE — ED Provider Notes (Signed)
CSN: [id number]     Arrival date & time 05/05/13  1611 History     First MD Initiated Contact with Patient 05/05/13 1646     Chief Complaint  Patient presents with  . Hand Pain  . Foot Pain   (Consider location/radiation/quality/duration/timing/severity/associated sxs/prior Treatment) HPI Comments: Miguel Porter is a 47 y.o. male who presents to the Emergency Department complaining of left foot pain for 2 months and right finger pain for 4 months.  States the finger swells and painful to bend.  He denies known injury but states that he uses his hands frequently and states pain waxes and wanes and sometimes present in other fingers as well.  He states his left foot hurts with weight bearing and resolves with rest.  States he has a "knot" of the lateral side of his little toe that causes pain.  He denies trauma, skin injury, redness or numbness of the foot.  No hx of DM.  The history is provided by the patient.    Past Medical History  Diagnosis Date  . Kidney stone    Past Surgical History  Procedure Laterality Date  . Lithotripsy    . Cholecystectomy  01/01/2012    Procedure: LAPAROSCOPIC CHOLECYSTECTOMY;  Surgeon: Dalia Heading, MD;  Location: [facility];  Service: General;  Laterality: N/A;   Family History  Problem Relation Age of Onset  . Hypertension Father    History  Substance Use Topics  . Smoking status: Current Every Day Smoker -- 1.00 packs/day for 25 years    Types: Cigarettes  . Smokeless tobacco: Not on file  . Alcohol Use: Yes     Comment: very little, 6 pack beer/year    Review of Systems  Constitutional: Negative for fever and chills.  Genitourinary: Negative for dysuria and difficulty urinating.  Musculoskeletal: Positive for joint swelling and arthralgias.  Skin: Negative for color change and wound.  All other systems reviewed and are negative.    Allergies  Review of patient's allergies indicates no known allergies.  Home Medications   Current  Outpatient Rx  Name  Route  Sig  Dispense  Refill  . ibuprofen (ADVIL,MOTRIN) 200 MG tablet   Oral   Take 600 mg by mouth every 6 (six) hours as needed for pain.         . naproxen sodium (ALEVE) 220 MG tablet   Oral   Take 440 mg by mouth daily as needed (pain).          BP 124/73  Pulse 90  Temp(Src) 98.4 F (36.9 C) (Oral)  Resp 16  Ht 6' (1.829 m)  Wt 210 lb (95.255 kg)  BMI 28.47 kg/m2  SpO2 99% Physical Exam  Nursing note and vitals reviewed. Constitutional: He is oriented to person, place, and time. He appears well-developed and well-nourished. No distress.  HENT:  Head: Normocephalic and atraumatic.  Cardiovascular: Normal rate, regular rhythm, normal heart sounds and intact distal pulses.   No murmur heard. Pulmonary/Chest: Effort normal and breath sounds normal. No respiratory distress.  Musculoskeletal: He exhibits edema and tenderness.       Right hand: He exhibits decreased range of motion, tenderness, bony tenderness and swelling. He exhibits normal two-point discrimination and no laceration. Normal sensation noted. Normal strength noted.       Hands:      Left foot: He exhibits tenderness and swelling. He exhibits normal range of motion, no bony tenderness, normal capillary refill, no crepitus, no deformity and no  laceration.       Feet:  Neurological: He is alert and oriented to person, place, and time. He exhibits normal muscle tone. Coordination normal.  Skin: Skin is warm and dry.    ED Course   Procedures (including critical care time)  Labs Reviewed - No data to display  Dg Finger Middle Right  05/05/2013   *RADIOLOGY REPORT*  Clinical Data: Pain involving the PIP joint of the right long finger.  No known injury.  RIGHT MIDDLE FINGER 2+V  Comparison: None.  Findings: Soft tissue swelling overlying the PIP joint.  No evidence of acute or subacute fracture or dislocation.  Joint spaces well preserved.  Bone mineral density well preserved.   IMPRESSION: No osseous abnormality.  Soft tissue swelling.   Original Report Authenticated By: Hulan Saas, M.D.   Dg Foot Complete Left  05/05/2013   *RADIOLOGY REPORT*  Clinical Data: Left foot pain.  No known injury.  LEFT FOOT - COMPLETE 3+ VIEW  Comparison: None.  Findings: Marked soft tissue swelling underlying and the adjacent to the 5th MTP joint.  No associated erosions.  No evidence of acute or subacute fracture or dislocation.  Well-preserved joint spaces.  Well-preserved bone mineral density.  IMPRESSION: No osseous abnormality.  Marked soft tissue swelling adjacent to and underlying the 5th MTP joint.   Original Report Authenticated By: Hulan Saas, M.D.     MDM    Patient reviewed on the [place] narcotic database.  No recent narcotics filled.   Finger appears c/w OA vs tendonitis, no concerning sx's for infectious process of the finger.     Finger splinted , pt agrees to close f/u with PMD , referral given for orthopedics.  Appears stable for d/c.   Jahree Dermody L. Debraann Livingstone, PA-C 05/07/13 1046

## 2013-05-05 NOTE — ED Notes (Signed)
Pain  /swelling of rt hand for several mos. No known injury.  Swelling esp of middle finger.  Pain / swelling of lt foot lateral aspect , near 5th toe.

## 2013-05-05 NOTE — ED Notes (Signed)
C/o "knot" on left foot x 6-8 wks, c/o pain.  Also c/o swelling to joints in hands x 4 months.

## 2013-05-11 NOTE — ED Provider Notes (Signed)
Medical screening examination/treatment/procedure(s) were performed by non-physician practitioner and as supervising physician I was immediately available for consultation/collaboration.  Raeford Razor, MD 05/11/13 [phone]

## 2021-06-01 NOTE — Progress Notes (Signed)
Office Visit Note  Patient: Miguel Porter             Date of Birth: Jul 11, 1966           MRN: [id number]             PCP: Marylynn Pearson, FNP Referring: Marylynn Pearson, FNP Visit Date: 06/02/2021  Subjective:  New Patient (Initial Visit) (Total body joint pain)   History of Present Illness: Miguel Porter is a 55 y.o. male here for history of RA. He has not been seeing a rheumatologist for his joint inflammation and generally had not been following up with doctors based in part from lack of insurance until more recently.  Joint pain in numerous sites worse problems affecting his hands typically exacerbations with wintertime.  He has increased difficulty with grip strength and ROM with some catching or sticking particularly right middle finger which has become fixed in a contracted position. Past treatments include prednisone otherwise not sure. He has a high level of stress or anxiety possible bipolar disorder seeing psychiatry and self treats with second hand benzodiazepines.  He does have a history of Porter stones but normal renal function, was never told what kind of stones.  Labs reviewed 02/2021 CBC wnl BMP wnl Vit D 24.9 UA Calcium oxalate crystals present TSH wnl  Activities of Daily Living:  Patient reports morning stiffness for 2 hours.   Patient Reports nocturnal pain.  Difficulty dressing/grooming: Reports Difficulty climbing stairs: Reports Difficulty getting out of chair: Reports Difficulty using hands for taps, buttons, cutlery, and/or writing: Reports  Review of Systems  Constitutional:  Positive for fatigue.  HENT:  Negative for mouth dryness.   Eyes:  Positive for dryness.  Respiratory:  Negative for shortness of breath.   Cardiovascular:  Negative for swelling in legs/feet.  Gastrointestinal:  Negative for constipation.  Endocrine: Negative for excessive thirst.  Genitourinary:  Negative for difficulty urinating.  Musculoskeletal:  Positive for joint  pain, joint pain, joint swelling and morning stiffness.  Skin:  Negative for rash.  Allergic/Immunologic: Negative for susceptible to infections.  Neurological:  Positive for weakness.  Hematological:  Negative for bruising/bleeding tendency.  Psychiatric/Behavioral:  Positive for sleep disturbance.    PMFS History:  Patient Active Problem List   Diagnosis Date Noted   History of rheumatoid arthritis 06/02/2021   Bilateral hand pain 06/02/2021   Low back pain 06/02/2021   Nail dystrophy 06/02/2021   High risk medication use 06/02/2021     Past Medical History:  Diagnosis Date   Porter stone    Rheumatoid arthritis (HCC)     Family History  Problem Relation Age of Onset   Hypertension Father    Heart Problems Father    Past Surgical History:  Procedure Laterality Date   CHOLECYSTECTOMY  01/01/2012   Procedure: LAPAROSCOPIC CHOLECYSTECTOMY;  Surgeon: Dalia Heading, MD;  Location: [facility];  Service: General;  Laterality: N/A;   LITHOTRIPSY     Social History   Social History Narrative   Not on file   Immunization History  Administered Date(s) Administered   PFIZER(Purple Top)SARS-COV-2 Vaccination 04/15/2020, 05/10/2020     Objective: Vital Signs: BP 104/69 (BP Location: Right Arm, Patient Position: Sitting, Cuff Size: Normal)   Pulse (!) 109   Resp 16   Ht 6' (1.829 m)   Wt 210 lb (95.3 kg)   BMI 28.48 kg/m    Physical Exam HENT:     Mouth/Throat:     Mouth: Mucous  membranes are moist.     Pharynx: Oropharynx is clear.  Cardiovascular:     Rate and Rhythm: Regular rhythm. Tachycardia present.  Pulmonary:     Effort: Pulmonary effort is normal.     Breath sounds: Normal breath sounds.  Musculoskeletal:     Right lower leg: No edema.     Left lower leg: No edema.  Skin:    General: Skin is warm and dry.     Findings: No rash.  Neurological:     Mental Status: He is alert.     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  Imaging: XR Hand 2 View Left  Result Date: 06/03/2021 X-ray left hand 2 views Radiocarpal joint space appears normal.  Mild first CMC degenerative changes with slight joint subluxation. MCP and PIP joint spaces unremarkable.  Probable early lateral osteophytes at DIPs.  No erosions or significant lateral osteophytes or periosteal reaction.  Bone mineralization appears normal. 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  XR Hand 2 View Right  Result Date: 06/03/2021 X-ray right hand 2 views Radiocarpal joint space appears normal.  Mild degenerative appearing change at first [facility] joint.  MCP joints appear normal.  Third PIP and flexion deformity with ulnar deviation no clearly visible erosions.  Other PIP and DIP joint spaces appear overall normal.  Bone mineralization appears normal. 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  Procedures:  No procedures performed Allergies: Patient has no known allergies.   Assessment / Plan:     Visit Diagnoses: History of rheumatoid arthritis - Plan: Rheumatoid factor, Cyclic citrul peptide antibody, IgG, Sedimentation rate, C-reactive protein  History of rheumatoid arthritis and chronic deforming changes on the right hand appear consistent with this and with a moderate degree of activity.  We will check rheumatoid factor and CCP antibodies.  Checking sedimentation rate and CRP for disease activity assessment.  Checking  bilateral hand x-rays for any evidence of erosive disease changes.  Pretty much is starting over discussed plan for starting DMARD follow-up.  Chronic bilateral low back pain without sciatica  Low back pain not a typical site of rheumatoid arthritis activity suspect this is just from existing degenerative joint disease.  Nail dystrophy  Dystrophic nails toes appear consistent with onychomycosis.  Discussed he could pursue treatment with PCP or podiatry for this but will likely take a while to resolve.  No evidence for psoriatic disease changes.  High risk medication use - Plan: QuantiFERON-TB Gold Plus, Hepatitis B core antibody, IgM, Hepatitis B surface antigen, Hepatitis C antibody  Anticipate starting DMARD treatment for RA such as methotrexate checking baseline labs with hepatitis B and C serology and screening for TB.  No recent chest x-ray available for review.  Recent CBC and metabolic panel in June within normal limits reviewed.  Orders: Orders Placed This Encounter  Procedures   XR Hand 2 View Right   XR Hand 2 View Left   Rheumatoid  factor   Cyclic citrul peptide antibody, IgG   Sedimentation rate   C-reactive protein   QuantiFERON-TB Gold Plus   Hepatitis B core antibody, IgM   Hepatitis B surface antigen   Hepatitis C antibody    No orders of the defined types were placed in this encounter.   Follow-Up Instructions: Return in about 2 weeks (around 06/16/2021) for New pt Hx RA f/u 2wks.   Fuller Plan, MD  Note - This record has been created using [employer].  Chart creation errors have been sought, but may not always  have been located. Such creation errors do not reflect on  the standard of medical care.

## 2021-06-02 ENCOUNTER — Ambulatory Visit: Payer: Medicaid Other | Admitting: Internal Medicine

## 2021-06-02 ENCOUNTER — Ambulatory Visit: Payer: Self-pay

## 2021-06-02 ENCOUNTER — Encounter: Payer: Self-pay | Admitting: Internal Medicine

## 2021-06-02 ENCOUNTER — Other Ambulatory Visit: Payer: Self-pay

## 2021-06-02 VITALS — BP 104/69 | HR 109 | Resp 16 | Ht 72.0 in | Wt 210.0 lb

## 2021-06-02 DIAGNOSIS — M79641 Pain in right hand: Secondary | ICD-10-CM | POA: Diagnosis not present

## 2021-06-02 DIAGNOSIS — Z79899 Other long term (current) drug therapy: Secondary | ICD-10-CM | POA: Insufficient documentation

## 2021-06-02 DIAGNOSIS — M79642 Pain in left hand: Secondary | ICD-10-CM | POA: Diagnosis not present

## 2021-06-02 DIAGNOSIS — L603 Nail dystrophy: Secondary | ICD-10-CM

## 2021-06-02 DIAGNOSIS — M545 Low back pain, unspecified: Secondary | ICD-10-CM | POA: Insufficient documentation

## 2021-06-02 DIAGNOSIS — Z8739 Personal history of other diseases of the musculoskeletal system and connective tissue: Secondary | ICD-10-CM

## 2021-06-02 DIAGNOSIS — G8929 Other chronic pain: Secondary | ICD-10-CM

## 2021-06-02 DIAGNOSIS — M059 Rheumatoid arthritis with rheumatoid factor, unspecified: Secondary | ICD-10-CM | POA: Insufficient documentation

## 2021-06-05 LAB — HEPATITIS B SURFACE ANTIGEN: Hepatitis B Surface Ag: NONREACTIVE

## 2021-06-05 LAB — HEPATITIS C ANTIBODY
Hepatitis C Ab: NONREACTIVE
SIGNAL TO CUT-OFF: 0.03 (ref <1.00)

## 2021-06-05 LAB — QUANTIFERON-TB GOLD PLUS
Mitogen-NIL: 2.9 IU/mL
NIL: 0.02 IU/mL
QuantiFERON-TB Gold Plus: NEGATIVE
TB1-NIL: 0 IU/mL
TB2-NIL: 0 IU/mL

## 2021-06-05 LAB — HEPATITIS B CORE ANTIBODY, IGM: Hep B C IgM: NONREACTIVE

## 2021-06-05 LAB — CYCLIC CITRUL PEPTIDE ANTIBODY, IGG: Cyclic Citrullin Peptide Ab: >250 UNITS — ABNORMAL HIGH

## 2021-06-05 LAB — SEDIMENTATION RATE: Sed Rate: 39 mm/h — ABNORMAL HIGH (ref 0–20)

## 2021-06-05 LAB — C-REACTIVE PROTEIN: CRP: 33.7 mg/L — ABNORMAL HIGH (ref <8.0)

## 2021-06-05 LAB — RHEUMATOID FACTOR: Rhuematoid fact SerPl-aCnc: 259 IU/mL — ABNORMAL HIGH (ref <14)

## 2021-06-22 NOTE — Progress Notes (Signed)
Office Visit Note  Patient: Miguel Porter             Date of Birth: 02/05/1966           MRN: [id number]             PCP: Denyce Robert, [place] Referring: Denyce Robert, FNP Visit Date: 06/23/2021   Subjective:  Follow-up (Doing good)   History of Present Illness: Miguel Porter is a 55 y.o. male here for follow up for seropositive, deforming rheumatoid arthritis after initial visit. His xrays showed no erosive disease changes labs were highly positive for RF and CCP, also high ESR and CRP. He feels overall well but does have some left wrist pain and stiffness today.  Previous HPI 06/02/21 Miguel Porter is a 55 y.o. male here for history of RA. He has not been seeing a rheumatologist for his joint inflammation and generally had not been following up with doctors based in part from lack of insurance until more recently.  Joint pain in numerous sites worse problems affecting his hands typically exacerbations with wintertime.  He has increased difficulty with grip strength and ROM with some catching or sticking particularly right middle finger which has become fixed in a contracted position. Past treatments include prednisone otherwise not sure. He has a high level of stress or anxiety possible bipolar disorder seeing psychiatry and self treats with second hand benzodiazepines.  He does have a history of kidney stones but normal renal function, was never told what kind of stones.   Labs reviewed 02/2021 CBC wnl BMP wnl Vit D 24.9 UA Calcium oxalate crystals present TSH wnl   Review of Systems  Constitutional:  Positive for fatigue.  HENT:  Positive for mouth dryness.   Eyes:  Positive for dryness.  Respiratory:  Negative for shortness of breath.   Cardiovascular:  Negative for swelling in legs/feet.  Gastrointestinal:  Negative for constipation.  Endocrine: Negative for excessive thirst.  Genitourinary:  Negative for difficulty urinating.  Musculoskeletal:  Positive for joint  pain, joint pain, joint swelling and morning stiffness.  Skin:  Negative for rash.  Allergic/Immunologic: Negative for susceptible to infections.  Neurological:  Negative for numbness.  Hematological:  Negative for bruising/bleeding tendency.  Psychiatric/Behavioral:  Positive for sleep disturbance.    PMFS History:  Patient Active Problem List   Diagnosis Date Noted   Seropositive rheumatoid arthritis ([place]) 06/02/2021   Bilateral hand pain 06/02/2021   Low back pain 06/02/2021   Nail dystrophy 06/02/2021   High risk medication use 06/02/2021    Past Medical History:  Diagnosis Date   Kidney stone    Rheumatoid arthritis ([place])     Family History  Problem Relation Age of Onset   Hypertension Father    Heart Problems Father    Past Surgical History:  Procedure Laterality Date   CHOLECYSTECTOMY  01/01/2012   Procedure: LAPAROSCOPIC CHOLECYSTECTOMY;  Surgeon: Jamesetta So, MD;  Location: [facility];  Service: General;  Laterality: N/A;   LITHOTRIPSY     Social History   Social History Narrative   Not on file   Immunization History  Administered Date(s) Administered   PFIZER(Purple Top)SARS-COV-2 Vaccination 04/15/2020, 05/10/2020     Objective: Vital Signs: BP 121/74 (BP Location: Left Arm, Patient Position: Sitting, Cuff Size: Normal)   Pulse (!) 111   Resp 16   Ht 6' (1.829 m)   Wt 216 lb (98 kg)   BMI 29.29 kg/m    Physical Exam  Skin:    General: Skin is warm and dry.     Findings: No rash.  Neurological:     Mental Status: He is alert.     Musculoskeletal Exam:  Left wrist tenderness to pressure, without palpable synovitis Right 3rd finger tenderness, chronic contracture in flexed PIP position  Investigation: No additional findings.  Imaging: XR Hand 2 View Left  Result Date: 06/03/2021 X-ray left hand 2 views Radiocarpal joint space appears normal.  Mild first CMC degenerative changes with slight joint subluxation. MCP and PIP joint spaces  unremarkable.  Probable early lateral osteophytes at DIPs.  No erosions or significant lateral osteophytes or periosteal reaction.  Bone mineralization appears normal. 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  XR Hand 2 View Right  Result Date: 06/03/2021 X-ray right hand 2 views Radiocarpal joint space appears normal.  Mild degenerative appearing change at first [facility] joint.  MCP joints appear normal.  Third PIP and flexion deformity with ulnar deviation no clearly visible erosions.  Other PIP and DIP joint spaces appear overall normal.  Bone mineralization appears normal. Impression Mild first CMC joint arthritis, third PIP flexed and deviated no clearly visible erosion or ankylosis possible chronic nonerosive arthritis   Recent Labs: Lab Results  Component Value Date   WBC 10.1 01/02/2012   HGB 12.5 (L) 01/02/2012   PLT 222 01/02/2012   NA 133 (L) 01/02/2012   K 4.0 01/02/2012   CL 97 01/02/2012   CO2 29 01/02/2012   GLUCOSE 116 (H) 01/02/2012   BUN 6 01/02/2012   CREATININE 0.84 01/02/2012   BILITOT 0.4 01/02/2012   ALKPHOS 90 01/02/2012   AST 73 (H) 01/02/2012   ALT 47 01/02/2012   PROT 6.9 01/02/2012   ALBUMIN 3.2 (L) 01/02/2012   CALCIUM 9.2 01/02/2012   GFRAA >90 01/02/2012   QFTBGOLDPLUS NEGATIVE 06/02/2021    Speciality Comments: No specialty comments available.  Procedures:  No procedures performed Allergies: Patient has no known allergies.   Assessment / Plan:     Visit Diagnoses: Seropositive rheumatoid arthritis ([place]) - Plan: hydroxychloroquine (PLAQUENIL) 200 MG tablet, Ambulatory referral to Ophthalmology  Results are highly consistent with active seropositive, nonerosive rheumatoid arthritis. He is interested in pursuing less aggressive treatment options symptoms are not in much exacerbation today. With no erosive changes on imaging recommended hydroxychloroquine as an appropriate option vs alternative methotrexate. Will start HCQ 200 mg  PO daily. Referral to ophthalmology for retinal toxicity monitoring. F/U in 8 wks to recheck response to treatment.  Orders: Orders Placed This Encounter  Procedures   Ambulatory referral to Ophthalmology    Meds ordered this encounter  Medications   hydroxychloroquine (PLAQUENIL) 200 MG tablet    Sig: Take 1 tablet (200 mg total) by mouth daily.    Dispense:  30 tablet    Refill:  2      Follow-Up Instructions: No follow-ups on file.   Collier Salina, MD  Note - This record has been created using [employer].  Chart creation errors have been sought, but may not always  have been located. Such creation errors do not reflect on  the standard of medical care.

## 2021-06-23 ENCOUNTER — Ambulatory Visit (INDEPENDENT_AMBULATORY_CARE_PROVIDER_SITE_OTHER): Payer: Medicaid Other | Admitting: Internal Medicine

## 2021-06-23 ENCOUNTER — Encounter: Payer: Self-pay | Admitting: Internal Medicine

## 2021-06-23 ENCOUNTER — Other Ambulatory Visit: Payer: Self-pay

## 2021-06-23 VITALS — BP 121/74 | HR 111 | Resp 16 | Ht 72.0 in | Wt 216.0 lb

## 2021-06-23 DIAGNOSIS — M059 Rheumatoid arthritis with rheumatoid factor, unspecified: Secondary | ICD-10-CM | POA: Diagnosis not present

## 2021-06-23 MED ORDER — HYDROXYCHLOROQUINE SULFATE 200 MG PO TABS: 200.0000 mg | ORAL_TABLET | Freq: Every day | ORAL | 2 refills | Status: DC

## 2021-06-23 NOTE — Patient Instructions (Signed)
Hydroxychloroquine Tablets What is this medication? HYDROXYCHLOROQUINE (hye drox ee KLOR [place]) treats autoimmune conditions, such as rheumatoid arthritis and lupus. It works by slowing down an overactive immune system. It may also be used to prevent and treat malaria. It works by killing the parasite that causes malaria. It belongs to a group of medications called DMARDs. This medicine may be used for other purposes; ask your health care provider or pharmacist if you have questions. COMMON BRAND NAME(S): Plaquenil, Quineprox What should I tell my care team before I take this medication? They need to know if you have any of these conditions: Diabetes Eye disease, vision problems G6PD deficiency Heart disease History of irregular heartbeat If you often drink alcohol Kidney disease Liver disease Porphyria Psoriasis An unusual or allergic reaction to chloroquine, hydroxychloroquine, other medications, foods, dyes, or preservatives Pregnant or trying to get pregnant Breast-feeding How should I use this medication? Take this medication by mouth with a glass of water. Take it as directed on the prescription label. Do not cut, crush or chew this medication. Swallow the tablets whole. Take it with food. Do not take it more than directed. Take all of this medication unless your care team tells you to stop it early. Keep taking it even if you think you are better. Take products with antacids in them at a different time of day than this medication. Take this medication 4 hours before or 4 hours after antacids. Talk to your care team if you have questions. Talk to your care team about the use of this medication in children. While this medication may be prescribed for selected conditions, precautions do apply. Overdosage: If you think you have taken too much of this medicine contact a poison control center or emergency room at once. NOTE: This medicine is only for you. Do not share this medicine with  others. What if I miss a dose? If you miss a dose, take it as soon as you can. If it is almost time for your next dose, take only that dose. Do not take double or extra doses. What may interact with this medication? Do not take this medication with any of the following: Cisapride Dronedarone Pimozide Thioridazine This medication may also interact with the following: Ampicillin Antacids Cimetidine Cyclosporine Digoxin Kaolin Medications for diabetes, like insulin, glipizide, glyburide Medications for seizures like carbamazepine, phenobarbital, phenytoin Mefloquine Methotrexate Other medications that prolong the QT interval (cause an abnormal heart rhythm) Praziquantel This list may not describe all possible interactions. Give your health care provider a list of all the medicines, herbs, non-prescription drugs, or dietary supplements you use. Also tell them if you smoke, drink alcohol, or use illegal drugs. Some items may interact with your medicine. What should I watch for while using this medication? Visit your care team for regular checks on your progress. Tell your care team if your symptoms do not start to get better or if they get worse. You may need blood work done while you are taking this medication. If you take other medications that can affect heart rhythm, you may need more testing. Talk to your care team if you have questions. Your vision may be tested before and during use of this medication. Tell your care team right away if you have any change in your eyesight. This medication may cause serious skin reactions. They can happen weeks to months after starting the medication. Contact your care team right away if you notice fevers or flu-like symptoms with a rash. The   rash may be red or purple and then turn into blisters or peeling of the skin. Or, you might notice a red rash with swelling of the face, lips or lymph nodes in your neck or under your arms. If you or your family  notice any changes in your behavior, such as new or worsening depression, thoughts of harming yourself, anxiety, or other unusual or disturbing thoughts, or memory loss, call your care team right away. What side effects may I notice from receiving this medication? Side effects that you should report to your care team as soon as possible: Allergic reactions-skin rash, itching, hives, swelling of the face, lips, tongue, or throat Aplastic anemia-unusual weakness or fatigue, dizziness, headache, trouble breathing, increased bleeding or bruising Change in vision Heart rhythm changes-fast or irregular heartbeat, dizziness, feeling faint or lightheaded, chest pain, trouble breathing Infection-fever, chills, cough, or sore throat Low blood sugar (hypoglycemia)-tremors or shaking, anxiety, sweating, cold or clammy skin, confusion, dizziness, rapid heartbeat Muscle injury-unusual weakness or fatigue, muscle pain, dark yellow or brown urine, decrease in amount of urine Pain, tingling, or numbness in the hands or feet Rash, fever, and swollen lymph nodes Redness, blistering, peeling, or loosening of the skin, including inside the mouth Thoughts of suicide or self-harm, worsening mood, or feelings of depression Unusual bruising or bleeding Side effects that usually do not require medical attention (report to your care team if they continue or are bothersome): Diarrhea Headache Nausea Stomach pain Vomiting This list may not describe all possible side effects. Call your doctor for medical advice about side effects. You may report side effects to FDA at 1-800-FDA-1088. Where should I keep my medication? Keep out of the reach of children and pets. Store at room temperature up to 30 degrees C (86 degrees F). Protect from light. Get rid of any unused medication after the expiration date. To get rid of medications that are no longer needed or have expired: Take the medication to a medication take-back  program. Check with your pharmacy or law enforcement to find a location. If you cannot return the medication, check the label or package insert to see if the medication should be thrown out in the garbage or flushed down the toilet. If you are not sure, ask your care team. If it is safe to put it in the trash, empty the medication out of the container. Mix the medication with cat litter, dirt, coffee grounds, or other unwanted substance. Seal the mixture in a bag or container. Put it in the trash. NOTE: This sheet is a summary. It may not cover all possible information. If you have questions about this medicine, talk to your doctor, pharmacist, or health care provider.  2022 Elsevier/Gold Standard (2020-12-10 10:19:21)  

## 2021-08-24 NOTE — Progress Notes (Deleted)
Office Visit Note  Patient: Miguel Porter             Date of Birth: [birth year]           MRN: [id number]             PCP: Denyce Robert, [place] Referring: Denyce Robert, FNP Visit Date: 08/25/2021   Subjective:  No chief complaint on file.   History of Present Illness: Miguel Porter is a 55 y.o. male here for follow up for seropositive RA after starting hydroxychloroqiune 200 mg daily.***   Previous HPI 06/23/21 Miguel Porter is a 55 y.o. male here for follow up for seropositive, deforming rheumatoid arthritis after initial visit. His xrays showed no erosive disease changes labs were highly positive for RF and CCP, also high ESR and CRP. He feels overall well but does have some left wrist pain and stiffness today.   Previous HPI 06/02/21 Miguel Porter is a 55 y.o. male here for history of RA. He has not been seeing a rheumatologist for his joint inflammation and generally had not been following up with doctors based in part from lack of insurance until more recently.  Joint pain in numerous sites worse problems affecting his hands typically exacerbations with wintertime.  He has increased difficulty with grip strength and ROM with some catching or sticking particularly right middle finger which has become fixed in a contracted position. Past treatments include prednisone otherwise not sure. He has a high level of stress or anxiety possible bipolar disorder seeing psychiatry and self treats with second hand benzodiazepines.  He does have a history of kidney stones but normal renal function, was never told what kind of stones.   Labs reviewed 02/2021 CBC wnl BMP wnl Vit D 24.9 UA Calcium oxalate crystals present TSH wnl   No Rheumatology ROS completed.   PMFS History:  Patient Active Problem List   Diagnosis Date Noted   Seropositive rheumatoid arthritis ([place]) 06/02/2021   Bilateral hand pain 06/02/2021   Low back pain 06/02/2021   Nail dystrophy 06/02/2021   High risk  medication use 06/02/2021    Past Medical History:  Diagnosis Date   Kidney stone    Rheumatoid arthritis ([place])     Family History  Problem Relation Age of Onset   Hypertension Father    Heart Problems Father    Past Surgical History:  Procedure Laterality Date   CHOLECYSTECTOMY  01/01/2012   Procedure: LAPAROSCOPIC CHOLECYSTECTOMY;  Surgeon: Jamesetta So, MD;  Location: [facility];  Service: General;  Laterality: N/A;   LITHOTRIPSY     Social History   Social History Narrative   Not on file   Immunization History  Administered Date(s) Administered   PFIZER(Purple Top)SARS-COV-2 Vaccination 04/15/2020, 05/10/2020     Objective: Vital Signs: There were no vitals taken for this visit.   Physical Exam   Musculoskeletal Exam: ***  CDAI Exam: CDAI Score: -- Patient Global: --; Provider Global: -- Swollen: --; Tender: -- Joint Exam 08/25/2021   No joint exam has been documented for this visit   There is currently no information documented on the homunculus. Go to the Rheumatology activity and complete the homunculus joint exam.  Investigation: No additional findings.  Imaging: No results found.  Recent Labs: Lab Results  Component Value Date   WBC 10.1 01/02/2012   HGB 12.5 (L) 01/02/2012   PLT 222 01/02/2012   NA 133 (L) 01/02/2012   K 4.0 01/02/2012   CL  97 01/02/2012   CO2 29 01/02/2012   GLUCOSE 116 (H) 01/02/2012   BUN 6 01/02/2012   CREATININE 0.84 01/02/2012   BILITOT 0.4 01/02/2012   ALKPHOS 90 01/02/2012   AST 73 (H) 01/02/2012   ALT 47 01/02/2012   PROT 6.9 01/02/2012   ALBUMIN 3.2 (L) 01/02/2012   CALCIUM 9.2 01/02/2012   GFRAA >90 01/02/2012   QFTBGOLDPLUS NEGATIVE 06/02/2021    Speciality Comments: No specialty comments available.  Procedures:  No procedures performed Allergies: Patient has no known allergies.   Assessment / Plan:     Visit Diagnoses: No diagnosis found.  ***  Orders: No orders of the defined types were placed  in this encounter.  No orders of the defined types were placed in this encounter.    Follow-Up Instructions: No follow-ups on file.   Collier Salina, MD  Note - This record has been created using [employer].  Chart creation errors have been sought, but may not always  have been located. Such creation errors do not reflect on  the standard of medical care.

## 2021-08-25 ENCOUNTER — Ambulatory Visit: Payer: Medicaid Other | Admitting: Internal Medicine

## 2021-10-07 ENCOUNTER — Encounter: Payer: Self-pay | Admitting: Internal Medicine

## 2021-10-07 ENCOUNTER — Other Ambulatory Visit: Payer: Self-pay

## 2021-10-07 ENCOUNTER — Ambulatory Visit (INDEPENDENT_AMBULATORY_CARE_PROVIDER_SITE_OTHER): Payer: Medicaid Other | Admitting: Internal Medicine

## 2021-10-07 VITALS — BP 142/91 | HR 112 | Ht 72.0 in | Wt 240.0 lb

## 2021-10-07 DIAGNOSIS — M059 Rheumatoid arthritis with rheumatoid factor, unspecified: Secondary | ICD-10-CM | POA: Diagnosis not present

## 2021-10-07 DIAGNOSIS — Z79899 Other long term (current) drug therapy: Secondary | ICD-10-CM

## 2021-10-07 MED ORDER — METHOTREXATE 2.5 MG PO TABS: 15.0000 mg | ORAL_TABLET | ORAL | 2 refills | Status: DC

## 2021-10-07 MED ORDER — FOLIC ACID 1 MG PO TABS: 1.0000 mg | ORAL_TABLET | Freq: Every day | ORAL | 0 refills | Status: DC

## 2021-10-07 NOTE — Progress Notes (Signed)
Office Visit Note  Patient: Miguel Porter             Date of Birth: 1966-02-03           MRN: [id number]             PCP: Denyce Robert, FNP Referring: Denyce Robert, FNP Visit Date: 10/07/2021   Subjective:  Weakness, Joint Pain, and Fatigue (30 lbs of weight gain/ extreme fatigue / feels terrible )   History of Present Illness: Miguel Porter is a 56 y.o. male here for follow up for seropositive, deforming rheumatoid arthritis after starting hydroxychloroquine 200 mg daily after last visit. He feels overall very poorly with a lot of fatigue and he has gained over 30 pounds unintentionally within about 4 months. He stopped taking the olanzapine prescribed by psychiatry because it was making him feel intoxicated. He has some finger pain in a few joints with occasional swelling.   Previous HPI 06/23/21 Miguel Porter is a 56 y.o. male here for follow up for seropositive, deforming rheumatoid arthritis after initial visit. His xrays showed no erosive disease changes labs were highly positive for RF and CCP, also high ESR and CRP. He feels overall well but does have some left wrist pain and stiffness today.   Previous HPI 06/02/21 Miguel Porter is a 56 y.o. male here for history of RA. He has not been seeing a rheumatologist for his joint inflammation and generally had not been following up with doctors based in part from lack of insurance until more recently.  Joint pain in numerous sites worse problems affecting his hands typically exacerbations with wintertime.  He has increased difficulty with grip strength and ROM with some catching or sticking particularly right middle finger which has become fixed in a contracted position. Past treatments include prednisone otherwise not sure. He has a high level of stress or anxiety possible bipolar disorder seeing psychiatry and self treats with second hand benzodiazepines.  He does have a history of kidney stones but normal renal function, was never  told what kind of stones.   Review of Systems  Constitutional:  Positive for activity change, fatigue and weight gain.  HENT: Negative.    Eyes:  Positive for visual disturbance.  Respiratory: Negative.    Cardiovascular: Negative.   Gastrointestinal:  Positive for constipation and heartburn.  Endocrine: Negative.   Genitourinary: Negative.   Musculoskeletal:  Positive for joint pain, joint pain, myalgias, morning stiffness and myalgias.  Skin: Negative.   Allergic/Immunologic: Negative for susceptible to infections.  Neurological:  Positive for memory loss.  Hematological: Negative.   Psychiatric/Behavioral: Negative.     PMFS History:  Patient Active Problem List   Diagnosis Date Noted   Seropositive rheumatoid arthritis ([place]) 06/02/2021   Bilateral hand pain 06/02/2021   Low back pain 06/02/2021   Nail dystrophy 06/02/2021   High risk medication use 06/02/2021    Past Medical History:  Diagnosis Date   Kidney stone    Rheumatoid arthritis ([place])     Family History  Problem Relation Age of Onset   Hypertension Father    Heart Problems Father    Past Surgical History:  Procedure Laterality Date   CHOLECYSTECTOMY  01/01/2012   Procedure: LAPAROSCOPIC CHOLECYSTECTOMY;  Surgeon: Jamesetta So, MD;  Location: [facility];  Service: General;  Laterality: N/A;   LITHOTRIPSY     Social History   Social History Narrative   Not on file   Immunization History  Administered  Date(s) Administered   PFIZER(Purple Top)SARS-COV-2 Vaccination 04/15/2020, 05/10/2020     Objective: Vital Signs: BP (!) 142/91    Pulse (!) 112    Ht 6' (1.829 m)    Wt 240 lb (108.9 kg) Comment: has gained weight   BMI 32.55 kg/m    Physical Exam Constitutional:      Appearance: He is obese.  Cardiovascular:     Rate and Rhythm: Regular rhythm. Tachycardia present.  Pulmonary:     Effort: Pulmonary effort is normal.     Breath sounds: Normal breath sounds.  Skin:    General: Skin is warm and  dry.     Findings: No rash.  Neurological:     General: No focal deficit present.     Mental Status: He is alert.  Psychiatric:     Comments: Pressured speech     Musculoskeletal Exam:  Shoulders full ROM no tenderness or swelling Elbows full ROM no tenderness or swelling Wrists full ROM no tenderness or swelling Right 3rd PIP in only partially reducible flexion deformity, left 1st MCP tenderness, mild swelling in 2nd-3rd MCPs Knees full ROM no tenderness or swelling Ankles full ROM no tenderness or swelling   CDAI Exam: CDAI Score: 9  Patient Global: 30 mm; Provider Global: 20 mm Swollen: 2 ; Tender: 2  Joint Exam 10/07/2021      Right  Left  MCP 1      Tender  MCP 2     Swollen   MCP 3     Swollen   PIP 3   Tender        Investigation: No additional findings.  Imaging: No results found.  Recent Labs: Lab Results  Component Value Date   WBC 10.1 01/02/2012   HGB 12.5 (L) 01/02/2012   PLT 222 01/02/2012   NA 133 (L) 01/02/2012   K 4.0 01/02/2012   CL 97 01/02/2012   CO2 29 01/02/2012   GLUCOSE 116 (H) 01/02/2012   BUN 6 01/02/2012   CREATININE 0.84 01/02/2012   BILITOT 0.4 01/02/2012   ALKPHOS 90 01/02/2012   AST 73 (H) 01/02/2012   ALT 47 01/02/2012   PROT 6.9 01/02/2012   ALBUMIN 3.2 (L) 01/02/2012   CALCIUM 9.2 01/02/2012   GFRAA >90 01/02/2012   QFTBGOLDPLUS NEGATIVE 06/02/2021    Speciality Comments: No specialty comments available.  Procedures:  No procedures performed Allergies: Patient has no known allergies.   Assessment / Plan:     Visit Diagnoses: Seropositive rheumatoid arthritis ([place]) - Plan: Sedimentation rate, C-reactive protein, methotrexate (RHEUMATREX) 2.5 MG tablet, folic acid (FOLVITE) 1 MG tablet  Appears to have active disease still with some pain and swelling in a few finger joints. Not sure if much improved compared to before. I recommend we try changing to methotrexate as alternative medication also also HCQ could have  interaction with some psychiatric medication. Checking ESR and CRP today. Starting MTX 15 mg PO weekly and folic acid 1 mg daily assuming repeat baseline labs okay today.  High risk medication use - Plan: CBC with Differential/Platelet, COMPLETE METABOLIC PANEL WITH GFR  I don't think the weight gain is related to HCQ but could be affecting the severe fatigue. Discussed the risks and side effects with methotrexate including cytopenias, hepatotoxicity, pneumonitis, or GI intolerance. Checking CBC and CMP today for baseline monitoring labs, hepatitis screening negative last year.   Orders: Orders Placed This Encounter  Procedures   Sedimentation rate   C-reactive protein   CBC  with Differential/Platelet   COMPLETE METABOLIC PANEL WITH GFR   Meds ordered this encounter  Medications   methotrexate (RHEUMATREX) 2.5 MG tablet    Sig: Take 6 tablets (15 mg total) by mouth once a week. Caution:Chemotherapy. Protect from light.    Dispense:  30 tablet    Refill:  2   folic acid (FOLVITE) 1 MG tablet    Sig: Take 1 tablet (1 mg total) by mouth daily.    Dispense:  90 tablet    Refill:  0     Follow-Up Instructions: Return in about 3 months (around 01/05/2022) for RA MTX start f/u 13mo.   CCollier Salina MD  Note - This record has been created using [employer]  Chart creation errors have been sought, but may not always  have been located. Such creation errors do not reflect on  the standard of medical care.

## 2021-10-08 LAB — COMPLETE METABOLIC PANEL WITH GFR
AG Ratio: 1.2 (calc) (ref 1.0–2.5)
ALT: 14 U/L (ref 9–46)
AST: 17 U/L (ref 10–35)
Albumin: 4.3 g/dL (ref 3.6–5.1)
Alkaline phosphatase (APISO): 92 U/L (ref 35–144)
BUN: 16 mg/dL (ref 7–25)
CO2: 33 mmol/L — ABNORMAL HIGH (ref 20–32)
Calcium: 9.9 mg/dL (ref 8.6–10.3)
Chloride: 103 mmol/L (ref 98–110)
Creat: 1.27 mg/dL (ref 0.70–1.30)
Globulin: 3.5 g/dL (calc) (ref 1.9–3.7)
Glucose, Bld: 101 mg/dL — ABNORMAL HIGH (ref 65–99)
Potassium: 4.6 mmol/L (ref 3.5–5.3)
Sodium: 139 mmol/L (ref 135–146)
Total Bilirubin: 0.4 mg/dL (ref 0.2–1.2)
Total Protein: 7.8 g/dL (ref 6.1–8.1)
eGFR: 67 mL/min/{1.73_m2} (ref >=60)

## 2021-10-08 LAB — CBC WITH DIFFERENTIAL/PLATELET
Absolute Monocytes: 961 cells/uL — ABNORMAL HIGH (ref 200–950)
Basophils Absolute: 162 cells/uL (ref 0–200)
Basophils Relative: 1.9 %
Eosinophils Absolute: 553 cells/uL — ABNORMAL HIGH (ref 15–500)
Eosinophils Relative: 6.5 %
HCT: 43.2 % (ref 38.5–50.0)
Hemoglobin: 14.4 g/dL (ref 13.2–17.1)
Lymphs Abs: 2278 cells/uL (ref 850–3900)
MCH: 29.5 pg (ref 27.0–33.0)
MCHC: 33.3 g/dL (ref 32.0–36.0)
MCV: 88.5 fL (ref 80.0–100.0)
MPV: 9.9 fL (ref 7.5–12.5)
Monocytes Relative: 11.3 %
Neutro Abs: 4548 cells/uL (ref 1500–7800)
Neutrophils Relative %: 53.5 %
Platelets: 264 10*3/uL (ref 140–400)
RBC: 4.88 10*6/uL (ref 4.20–5.80)
RDW: 13.4 % (ref 11.0–15.0)
Total Lymphocyte: 26.8 %
WBC: 8.5 10*3/uL (ref 3.8–10.8)

## 2021-10-08 LAB — SEDIMENTATION RATE: Sed Rate: 33 mm/h — ABNORMAL HIGH (ref 0–20)

## 2021-10-08 LAB — C-REACTIVE PROTEIN: CRP: 4.4 mg/L (ref <8.0)

## 2021-10-08 NOTE — Progress Notes (Signed)
Inflammatory labs are partially better than before. There are no problems with his CBC or CMP so can take the methotrexate 15 mg PO weekly. We need to schedule his clinic follow up for 3 months.

## 2021-10-14 ENCOUNTER — Other Ambulatory Visit: Payer: Self-pay | Admitting: Internal Medicine

## 2021-10-14 ENCOUNTER — Telehealth: Payer: Self-pay

## 2021-10-14 NOTE — Telephone Encounter (Signed)
Patient called the office stating that when he stated MTX it gave him a rash within 2-3 hours and made him vomit. Patient states the medication Dr. Dimple Casey had him on first worked well and would like to start that again if Dr. Dimple Casey thinks that's ok. Patient states he would like to stop the MTX if he can.

## 2021-10-14 NOTE — Telephone Encounter (Signed)
Sorry to hear that he had such a bad medication reaction. We can resume the HCQ. We talked about possible medication reactions, but there was no current medication problem. He was taking 200 mg daily as a starting dose and had been tolerating this well so would plan to restart there.

## 2021-10-14 NOTE — Telephone Encounter (Signed)
Grover Canavan left a voicemail stating Miguel Porter is experiencing hives and throwing up and is not sure if he is having an allergic reaction to the medication.  Grover Canavan requested a return call at #[phone] or [phone]

## 2021-10-14 NOTE — Telephone Encounter (Signed)
See previous phone note for details 

## 2021-10-14 NOTE — Telephone Encounter (Signed)
Attempted to contact the patient and left message for patient to call the office.  

## 2021-10-14 NOTE — Telephone Encounter (Signed)
Patient states he did not take his dose of MTX this week. Patient states when he took the dose of MTX last week and had vomiting and then also had hives. Patient states they have resolved. Patient states it took about 3 days to get back to himself. Patient is interested in restarting the PLQ. Please advise.

## 2021-10-15 MED ORDER — HYDROXYCHLOROQUINE SULFATE 200 MG PO TABS: 200.0000 mg | ORAL_TABLET | Freq: Every day | ORAL | 0 refills | Status: DC

## 2021-10-15 NOTE — Addendum Note (Signed)
Addended by: Henriette Combs on: 10/15/2021 09:30 AM   Modules accepted: Orders

## 2021-10-15 NOTE — Telephone Encounter (Signed)
Patient advised sorry to hear that he had such a bad medication reaction. We can resume the HCQ. We talked about possible medication reactions, but there was no current medication problem. He was taking 200 mg daily as a starting dose and had been tolerating this well so would plan to restart there. Patient expressed understanding and states he needs a refill sent to the pharmacy.

## 2022-01-11 NOTE — Progress Notes (Deleted)
Office Visit Note  Patient: Miguel Porter             Date of Birth: October 15, 1965           MRN: [id number]             PCP: Marylynn Pearson, FNP Referring: Marylynn Pearson, FNP Visit Date: 01/12/2022   Subjective:  No chief complaint on file.   History of Present Illness: Miguel Porter is a 56 y.o. male here for follow up for seropositive RA after stopping HCQ and starting methotrexate 15 mg PO weekly.***   Previous HPI 10/07/21 Miguel Porter is a 56 y.o. male here for follow up for seropositive, deforming rheumatoid arthritis after starting hydroxychloroquine 200 mg daily after last visit. He feels overall very poorly with a lot of fatigue and he has gained over 30 pounds unintentionally within about 4 months. He stopped taking the olanzapine prescribed by psychiatry because it was making him feel intoxicated. He has some finger pain in a few joints with occasional swelling.   Previous HPI 06/02/21 Miguel Porter is a 56 y.o. male here for history of RA. He has not been seeing a rheumatologist for his joint inflammation and generally had not been following up with doctors based in part from lack of insurance until more recently.  Joint pain in numerous sites worse problems affecting his hands typically exacerbations with wintertime.  He has increased difficulty with grip strength and ROM with some catching or sticking particularly right middle finger which has become fixed in a contracted position. Past treatments include prednisone otherwise not sure. He has a high level of stress or anxiety possible bipolar disorder seeing psychiatry and self treats with second hand benzodiazepines.  He does have a history of kidney stones but normal renal function, was never told what kind of stones.   No Rheumatology ROS completed.   PMFS History:  Patient Active Problem List   Diagnosis Date Noted   Seropositive rheumatoid arthritis ([facility]) 06/02/2021   Bilateral hand pain 06/02/2021   Low back pain  06/02/2021   Nail dystrophy 06/02/2021   High risk medication use 06/02/2021    Past Medical History:  Diagnosis Date   Kidney stone    Rheumatoid arthritis ([facility])     Family History  Problem Relation Age of Onset   Hypertension Father    Heart Problems Father    Past Surgical History:  Procedure Laterality Date   CHOLECYSTECTOMY  01/01/2012   Procedure: LAPAROSCOPIC CHOLECYSTECTOMY;  Surgeon: Dalia Heading, MD;  Location: [facility];  Service: General;  Laterality: N/A;   LITHOTRIPSY     Social History   Social History Narrative   Not on file   Immunization History  Administered Date(s) Administered   PFIZER(Purple Top)SARS-COV-2 Vaccination 04/15/2020, 05/10/2020     Objective: Vital Signs: There were no vitals taken for this visit.   Physical Exam   Musculoskeletal Exam: ***  CDAI Exam: CDAI Score: -- Patient Global: --; Provider Global: -- Swollen: --; Tender: -- Joint Exam 01/12/2022   No joint exam has been documented for this visit   There is currently no information documented on the homunculus. Go to the Rheumatology activity and complete the homunculus joint exam.  Investigation: No additional findings.  Imaging: No results found.  Recent Labs: Lab Results  Component Value Date   WBC 8.5 10/07/2021   HGB 14.4 10/07/2021   PLT 264 10/07/2021   NA 139 10/07/2021   K 4.6  10/07/2021   CL 103 10/07/2021   CO2 33 (H) 10/07/2021   GLUCOSE 101 (H) 10/07/2021   BUN 16 10/07/2021   CREATININE 1.27 10/07/2021   BILITOT 0.4 10/07/2021   ALKPHOS 90 01/02/2012   AST 17 10/07/2021   ALT 14 10/07/2021   PROT 7.8 10/07/2021   ALBUMIN 3.2 (L) 01/02/2012   CALCIUM 9.9 10/07/2021   GFRAA >90 01/02/2012   QFTBGOLDPLUS NEGATIVE 06/02/2021    Speciality Comments: No specialty comments available.  Procedures:  No procedures performed Allergies: Patient has no known allergies.   Assessment / Plan:     Visit Diagnoses: No diagnosis  found.  ***  Orders: No orders of the defined types were placed in this encounter.  No orders of the defined types were placed in this encounter.    Follow-Up Instructions: No follow-ups on file.   Fuller Plan, MD  Note - This record has been created using [employer].  Chart creation errors have been sought, but may not always  have been located. Such creation errors do not reflect on  the standard of medical care.

## 2022-01-12 ENCOUNTER — Ambulatory Visit: Payer: Medicaid Other | Admitting: Internal Medicine

## 2022-01-19 NOTE — Progress Notes (Deleted)
 Office Visit Note  Patient: Miguel Porter             Date of Birth: 05/29/1966           MRN: [id number]             PCP: McCorkle, Tenika, FNP Referring: McCorkle, Tenika, FNP Visit Date: 02/02/2022   Subjective:  No chief complaint on file.   History of Present Illness: Miguel Porter is a 55 y.o. male here for follow up for seropositive, deforming rheumatoid arthritis   Previous HPI 10/07/2021  Miguel Porter is a 55 y.o. male here for follow up for seropositive, deforming rheumatoid arthritis after starting hydroxychloroquine 200 mg daily after last visit. He feels overall very poorly with a lot of fatigue and he has gained over 30 pounds unintentionally within about 4 months. He stopped taking the olanzapine prescribed by psychiatry because it was making him feel intoxicated. He has some finger pain in a few joints with occasional swelling.    Previous HPI 06/23/21 Miguel Porter is a 55 y.o. male here for follow up for seropositive, deforming rheumatoid arthritis after initial visit. His xrays showed no erosive disease changes labs were highly positive for RF and CCP, also high ESR and CRP. He feels overall well but does have some left wrist pain and stiffness today.   Previous HPI 06/02/21 Miguel Porter is a 55 y.o. male here for history of RA. He has not been seeing a rheumatologist for his joint inflammation and generally had not been following up with doctors based in part from lack of insurance until more recently.  Joint pain in numerous sites worse problems affecting his hands typically exacerbations with wintertime.  He has increased difficulty with grip strength and ROM with some catching or sticking particularly right middle finger which has become fixed in a contracted position. Past treatments include prednisone otherwise not sure. He has a high level of stress or anxiety possible bipolar disorder seeing psychiatry and self treats with second hand benzodiazepines.  He does have  a history of kidney stones but normal renal function, was never told what kind of stones.   No Rheumatology ROS completed.   PMFS History:  Patient Active Problem List   Diagnosis Date Noted   Seropositive rheumatoid arthritis ([facility]) 06/02/2021   Bilateral hand pain 06/02/2021   Low back pain 06/02/2021   Nail dystrophy 06/02/2021   High risk medication use 06/02/2021    Past Medical History:  Diagnosis Date   Kidney stone    Rheumatoid arthritis ([facility])     Family History  Problem Relation Age of Onset   Hypertension Father    Heart Problems Father    Past Surgical History:  Procedure Laterality Date   CHOLECYSTECTOMY  01/01/2012   Procedure: LAPAROSCOPIC CHOLECYSTECTOMY;  Surgeon: Mark A Jenkins, MD;  Location: [facility];  Service: General;  Laterality: N/A;   LITHOTRIPSY     Social History   Social History Narrative   Not on file   Immunization History  Administered Date(s) Administered   PFIZER(Purple Top)SARS-COV-2 Vaccination 04/15/2020, 05/10/2020     Objective: Vital Signs: There were no vitals taken for this visit.   Physical Exam   Musculoskeletal Exam: ***  CDAI Exam: CDAI Score: -- Patient Global: --; Provider Global: -- Swollen: --; Tender: -- Joint Exam 02/02/2022   No joint exam has been documented for this visit   There is currently no information documented on the homunculus. Go   to the Rheumatology activity and complete the homunculus joint exam.  Investigation: No additional findings.  Imaging: No results found.  Recent Labs: Lab Results  Component Value Date   WBC 8.5 10/07/2021   HGB 14.4 10/07/2021   PLT 264 10/07/2021   NA 139 10/07/2021   K 4.6 10/07/2021   CL 103 10/07/2021   CO2 33 (H) 10/07/2021   GLUCOSE 101 (H) 10/07/2021   BUN 16 10/07/2021   CREATININE 1.27 10/07/2021   BILITOT 0.4 10/07/2021   ALKPHOS 90 01/02/2012   AST 17 10/07/2021   ALT 14 10/07/2021   PROT 7.8 10/07/2021   ALBUMIN 3.2 (L) 01/02/2012    CALCIUM 9.9 10/07/2021   GFRAA >90 01/02/2012   QFTBGOLDPLUS NEGATIVE 06/02/2021    Speciality Comments: No specialty comments available.  Procedures:  No procedures performed Allergies: Patient has no known allergies.   Assessment / Plan:     Visit Diagnoses: No diagnosis found.  ***  Orders: No orders of the defined types were placed in this encounter.  No orders of the defined types were placed in this encounter.    Follow-Up Instructions: No follow-ups on file.    C , CMA  Note - This record has been created using Dragon software.  Chart creation errors have been sought, but may not always  have been located. Such creation errors do not reflect on  the standard of medical care.  

## 2022-02-02 ENCOUNTER — Ambulatory Visit: Payer: Medicaid Other | Admitting: Internal Medicine

## 2022-02-02 DIAGNOSIS — M059 Rheumatoid arthritis with rheumatoid factor, unspecified: Secondary | ICD-10-CM

## 2022-02-02 DIAGNOSIS — Z79899 Other long term (current) drug therapy: Secondary | ICD-10-CM

## 2022-02-12 NOTE — Progress Notes (Deleted)
Office Visit Note  Patient: Miguel Porter             Date of Birth: November 03, 1965           MRN: [id number]             PCP: Denyce Robert, [place] Referring: Denyce Robert, FNP Visit Date: 02/24/2022   Subjective:  No chief complaint on file.   History of Present Illness: Miguel Porter is a 55 y.o. male here for follow up for seropositive, deforming rheumatoid arthritis after starting hydroxychloroquine 200 mg daily    Previous HPI 10/07/2021 Miguel Porter is a 56 y.o. male here for follow up for seropositive, deforming rheumatoid arthritis after starting hydroxychloroquine 200 mg daily after last visit. He feels overall very poorly with a lot of fatigue and he has gained over 30 pounds unintentionally within about 4 months. He stopped taking the olanzapine prescribed by psychiatry because it was making him feel intoxicated. He has some finger pain in a few joints with occasional swelling.    Previous HPI 06/23/21 Miguel Porter is a 55 y.o. male here for follow up for seropositive, deforming rheumatoid arthritis after initial visit. His xrays showed no erosive disease changes labs were highly positive for RF and CCP, also high ESR and CRP. He feels overall well but does have some left wrist pain and stiffness today.   Previous HPI 06/02/21 Miguel Porter is a 56 y.o. male here for history of RA. He has not been seeing a rheumatologist for his joint inflammation and generally had not been following up with doctors based in part from lack of insurance until more recently.  Joint pain in numerous sites worse problems affecting his hands typically exacerbations with wintertime.  He has increased difficulty with grip strength and ROM with some catching or sticking particularly right middle finger which has become fixed in a contracted position. Past treatments include prednisone otherwise not sure. He has a high level of stress or anxiety possible bipolar disorder seeing psychiatry and self treats  with second hand benzodiazepines.  He does have a history of kidney stones but normal renal function, was never told what kind of stones.   No Rheumatology ROS completed.   PMFS History:  Patient Active Problem List   Diagnosis Date Noted   Seropositive rheumatoid arthritis ([place]) 06/02/2021   Bilateral hand pain 06/02/2021   Low back pain 06/02/2021   Nail dystrophy 06/02/2021   High risk medication use 06/02/2021    Past Medical History:  Diagnosis Date   Kidney stone    Rheumatoid arthritis ([place])     Family History  Problem Relation Age of Onset   Hypertension Father    Heart Problems Father    Past Surgical History:  Procedure Laterality Date   CHOLECYSTECTOMY  01/01/2012   Procedure: LAPAROSCOPIC CHOLECYSTECTOMY;  Surgeon: Jamesetta So, MD;  Location: [facility];  Service: General;  Laterality: N/A;   LITHOTRIPSY     Social History   Social History Narrative   Not on file   Immunization History  Administered Date(s) Administered   PFIZER(Purple Top)SARS-COV-2 Vaccination 04/15/2020, 05/10/2020     Objective: Vital Signs: There were no vitals taken for this visit.   Physical Exam   Musculoskeletal Exam: ***  CDAI Exam: CDAI Score: -- Patient Global: --; Provider Global: -- Swollen: --; Tender: -- Joint Exam 02/24/2022   No joint exam has been documented for this visit   There is currently no  information documented on the homunculus. Go to the Rheumatology activity and complete the homunculus joint exam.  Investigation: No additional findings.  Imaging: No results found.  Recent Labs: Lab Results  Component Value Date   WBC 8.5 10/07/2021   HGB 14.4 10/07/2021   PLT 264 10/07/2021   NA 139 10/07/2021   K 4.6 10/07/2021   CL 103 10/07/2021   CO2 33 (H) 10/07/2021   GLUCOSE 101 (H) 10/07/2021   BUN 16 10/07/2021   CREATININE 1.27 10/07/2021   BILITOT 0.4 10/07/2021   ALKPHOS 90 01/02/2012   AST 17 10/07/2021   ALT 14 10/07/2021   PROT 7.8  10/07/2021   ALBUMIN 3.2 (L) 01/02/2012   CALCIUM 9.9 10/07/2021   GFRAA >90 01/02/2012   QFTBGOLDPLUS NEGATIVE 06/02/2021    Speciality Comments: No specialty comments available.  Procedures:  No procedures performed Allergies: Patient has no known allergies.   Assessment / Plan:     Visit Diagnoses: No diagnosis found.  ***  Orders: No orders of the defined types were placed in this encounter.  No orders of the defined types were placed in this encounter.    Follow-Up Instructions: No follow-ups on file.   Earnestine Mealing, CMA  Note - This record has been created using [employer].  Chart creation errors have been sought, but may not always  have been located. Such creation errors do not reflect on  the standard of medical care.

## 2022-02-24 ENCOUNTER — Ambulatory Visit: Payer: Medicaid Other | Admitting: Internal Medicine

## 2022-02-24 DIAGNOSIS — M059 Rheumatoid arthritis with rheumatoid factor, unspecified: Secondary | ICD-10-CM

## 2022-02-24 DIAGNOSIS — Z79899 Other long term (current) drug therapy: Secondary | ICD-10-CM

## 2022-04-15 NOTE — Progress Notes (Deleted)
Office Visit Note  Patient: Miguel Porter             Date of Birth: 1966/03/15           MRN: [id number]             PCP: Denyce Robert, [place] Referring: Denyce Robert, FNP Visit Date: 04/27/2022   Subjective:  No chief complaint on file.   History of Present Illness: Miguel Porter is a 56 y.o. male here for follow up seropositive, deforming rheumatoid arthritis.   Previous HPI 10/07/2021 Miguel Porter is a 56 y.o. male here for follow up for seropositive, deforming rheumatoid arthritis after starting hydroxychloroquine 200 mg daily after last visit. He feels overall very poorly with a lot of fatigue and he has gained over 30 pounds unintentionally within about 4 months. He stopped taking the olanzapine prescribed by psychiatry because it was making him feel intoxicated. He has some finger pain in a few joints with occasional swelling.    Previous HPI 06/23/21 Miguel Porter is a 56 y.o. male here for follow up for seropositive, deforming rheumatoid arthritis after initial visit. His xrays showed no erosive disease changes labs were highly positive for RF and CCP, also high ESR and CRP. He feels overall well but does have some left wrist pain and stiffness today.   Previous HPI 06/02/21 Miguel Porter is a 56 y.o. male here for history of RA. He has not been seeing a rheumatologist for his joint inflammation and generally had not been following up with doctors based in part from lack of insurance until more recently.  Joint pain in numerous sites worse problems affecting his hands typically exacerbations with wintertime.  He has increased difficulty with grip strength and ROM with some catching or sticking particularly right middle finger which has become fixed in a contracted position. Past treatments include prednisone otherwise not sure. He has a high level of stress or anxiety possible bipolar disorder seeing psychiatry and self treats with second hand benzodiazepines.  He does have a  history of kidney stones but normal renal function, was never told what kind of stones.     No Rheumatology ROS completed.   PMFS History:  Patient Active Problem List   Diagnosis Date Noted   Seropositive rheumatoid arthritis ([place]) 06/02/2021   Bilateral hand pain 06/02/2021   Low back pain 06/02/2021   Nail dystrophy 06/02/2021   High risk medication use 06/02/2021    Past Medical History:  Diagnosis Date   Kidney stone    Rheumatoid arthritis ([place])     Family History  Problem Relation Age of Onset   Hypertension Father    Heart Problems Father    Past Surgical History:  Procedure Laterality Date   CHOLECYSTECTOMY  01/01/2012   Procedure: LAPAROSCOPIC CHOLECYSTECTOMY;  Surgeon: Jamesetta So, MD;  Location: [facility];  Service: General;  Laterality: N/A;   LITHOTRIPSY     Social History   Social History Narrative   Not on file   Immunization History  Administered Date(s) Administered   PFIZER(Purple Top)SARS-COV-2 Vaccination 04/15/2020, 05/10/2020     Objective: Vital Signs: There were no vitals taken for this visit.   Physical Exam   Musculoskeletal Exam: ***  CDAI Exam: CDAI Score: -- Patient Global: --; Provider Global: -- Swollen: --; Tender: -- Joint Exam 04/27/2022   No joint exam has been documented for this visit   There is currently no information documented on the homunculus. Go  to the Rheumatology activity and complete the homunculus joint exam.  Investigation: No additional findings.  Imaging: No results found.  Recent Labs: Lab Results  Component Value Date   WBC 8.5 10/07/2021   HGB 14.4 10/07/2021   PLT 264 10/07/2021   NA 139 10/07/2021   K 4.6 10/07/2021   CL 103 10/07/2021   CO2 33 (H) 10/07/2021   GLUCOSE 101 (H) 10/07/2021   BUN 16 10/07/2021   CREATININE 1.27 10/07/2021   BILITOT 0.4 10/07/2021   ALKPHOS 90 01/02/2012   AST 17 10/07/2021   ALT 14 10/07/2021   PROT 7.8 10/07/2021   ALBUMIN 3.2 (L) 01/02/2012    CALCIUM 9.9 10/07/2021   GFRAA >90 01/02/2012   QFTBGOLDPLUS NEGATIVE 06/02/2021    Speciality Comments: No specialty comments available.  Procedures:  No procedures performed Allergies: Patient has no known allergies.   Assessment / Plan:     Visit Diagnoses: No diagnosis found.  ***  Orders: No orders of the defined types were placed in this encounter.  No orders of the defined types were placed in this encounter.    Follow-Up Instructions: No follow-ups on file.   Bertram Savin, RT  Note - This record has been created using Editor, commissioning.  Chart creation errors have been sought, but may not always  have been located. Such creation errors do not reflect on  the standard of medical care.

## 2022-04-27 ENCOUNTER — Ambulatory Visit: Payer: Medicaid Other | Admitting: Internal Medicine

## 2022-04-27 DIAGNOSIS — Z79899 Other long term (current) drug therapy: Secondary | ICD-10-CM

## 2022-04-27 DIAGNOSIS — M059 Rheumatoid arthritis with rheumatoid factor, unspecified: Secondary | ICD-10-CM

## 2022-05-22 NOTE — Progress Notes (Signed)
Office Visit Note  Patient: Miguel Porter             Date of Birth: [birth year]           MRN: [id number]             PCP: Denyce Robert, [place] Referring: Denyce Robert, FNP Visit Date: 06/01/2022   Subjective:  Follow-up (Feeling ok today. Here for labs and refill. )   History of Present Illness: Miguel Porter is a 56 y.o. male here for follow up for seropositive, deforming rheumatoid arthritis on HCQ 200 mg daily. After last visit he started the methotrexate but tolerated the medicine extremely poorly with GI upset and severe fatigue and discontinued this.  Overall his symptoms have been doing reasonably well without severe flareups no new progression of joint changes or deformity.  He is staying pretty active has been able to play basketball for exercise.  He has upcoming plans for multiple tooth extractions.  Previous HPI 10/07/2021 Miguel Porter is a 56 y.o. male here for follow up for seropositive, deforming rheumatoid arthritis after starting hydroxychloroquine 200 mg daily after last visit. He feels overall very poorly with a lot of fatigue and he has gained over 30 pounds unintentionally within about 4 months. He stopped taking the olanzapine prescribed by psychiatry because it was making him feel intoxicated. He has some finger pain in a few joints with occasional swelling.    Previous HPI 06/23/21 Miguel Porter is a 56 y.o. male here for follow up for seropositive, deforming rheumatoid arthritis after initial visit. His xrays showed no erosive disease changes labs were highly positive for RF and CCP, also high ESR and CRP. He feels overall well but does have some left wrist pain and stiffness today.   Previous HPI 06/02/21 Miguel Porter is a 56 y.o. male here for history of RA. He has not been seeing a rheumatologist for his joint inflammation and generally had not been following up with doctors based in part from lack of insurance until more recently.  Joint pain in numerous  sites worse problems affecting his hands typically exacerbations with wintertime.  He has increased difficulty with grip strength and ROM with some catching or sticking particularly right middle finger which has become fixed in a contracted position. Past treatments include prednisone otherwise not sure. He has a high level of stress or anxiety possible bipolar disorder seeing psychiatry and self treats with second hand benzodiazepines.  He does have a history of kidney stones but normal renal function, was never told what kind of stones.     Review of Systems  Constitutional:  Positive for fatigue.  HENT:  Positive for mouth dryness. Negative for mouth sores.   Eyes:  Positive for dryness.  Respiratory:  Negative for shortness of breath.   Cardiovascular:  Negative for chest pain and palpitations.  Gastrointestinal:  Positive for constipation. Negative for blood in stool and diarrhea.  Endocrine: Negative for increased urination.  Genitourinary:  Negative for involuntary urination.  Musculoskeletal:  Positive for joint pain, gait problem, joint pain, joint swelling, myalgias, muscle weakness, morning stiffness, muscle tenderness and myalgias.  Skin:  Negative for color change, rash, hair loss and sensitivity to sunlight.  Allergic/Immunologic: Negative for susceptible to infections.  Neurological:  Negative for dizziness and headaches.  Hematological:  Negative for swollen glands.  Psychiatric/Behavioral:  Negative for depressed mood and sleep disturbance. The patient is nervous/anxious.     PMFS History:  Patient  Active Problem List   Diagnosis Date Noted   Seropositive rheumatoid arthritis ([place]) 06/02/2021   Bilateral hand pain 06/02/2021   Low back pain 06/02/2021   Nail dystrophy 06/02/2021   High risk medication use 06/02/2021    Past Medical History:  Diagnosis Date   Kidney stone    Rheumatoid arthritis ([place])     Family History  Problem Relation Age of Onset    Hypertension Father    Heart Problems Father    Past Surgical History:  Procedure Laterality Date   CHOLECYSTECTOMY  01/01/2012   Procedure: LAPAROSCOPIC CHOLECYSTECTOMY;  Surgeon: Jamesetta So, MD;  Location: [facility];  Service: General;  Laterality: N/A;   LITHOTRIPSY     Social History   Social History Narrative   Not on file   Immunization History  Administered Date(s) Administered   PFIZER(Purple Top)SARS-COV-2 Vaccination 04/15/2020, 05/10/2020     Objective: Vital Signs: BP (!) 128/90 (BP Location: Left Arm, Patient Position: Sitting, Cuff Size: Normal)   Pulse (!) 115   Resp 15   Ht 6' (1.829 m)   Wt 213 lb 6.4 oz (96.8 kg)   BMI 28.94 kg/m    Physical Exam Cardiovascular:     Rate and Rhythm: Regular rhythm. Tachycardia present.  Pulmonary:     Effort: Pulmonary effort is normal.     Breath sounds: Normal breath sounds.  Skin:    General: Skin is warm and dry.     Findings: No rash.  Neurological:     Mental Status: He is alert.  Psychiatric:     Comments: Pressured speech      Musculoskeletal Exam:  Shoulders full ROM no tenderness or swelling Elbows full ROM no tenderness or swelling Wrists full ROM no tenderness or swelling Fingers left third PIP joint with incompletely reducible flexion deformity no tenderness or palpable synovitis Knees full ROM no tenderness or swelling Ankles full ROM no tenderness or swelling   CDAI Exam: CDAI Score: -- Patient Global: --; Provider Global: -- Swollen: 0 ; Tender: 0  Joint Exam 06/01/2022   All documented joints were normal     Investigation: No additional findings.  Imaging: No results found.  Recent Labs: Lab Results  Component Value Date   WBC 8.5 10/07/2021   HGB 14.4 10/07/2021   PLT 264 10/07/2021   NA 139 10/07/2021   K 4.6 10/07/2021   CL 103 10/07/2021   CO2 33 (H) 10/07/2021   GLUCOSE 101 (H) 10/07/2021   BUN 16 10/07/2021   CREATININE 1.27 10/07/2021   BILITOT 0.4 10/07/2021    ALKPHOS 90 01/02/2012   AST 17 10/07/2021   ALT 14 10/07/2021   PROT 7.8 10/07/2021   ALBUMIN 3.2 (L) 01/02/2012   CALCIUM 9.9 10/07/2021   GFRAA >90 01/02/2012   QFTBGOLDPLUS NEGATIVE 06/02/2021    Speciality Comments: No specialty comments available.  Procedures:  No procedures performed Allergies: Patient has no known allergies.   Assessment / Plan:     Visit Diagnoses: Seropositive rheumatoid arthritis ([place]) - Plan: CBC with Differential/Platelet, hydroxychloroquine (PLAQUENIL) 200 MG tablet  Symptoms appear well controlled no active synovitis on exam today.  Medication may be helping he is also describing work on anti-inflammatory lifestyle modification.  More improvement to contracted finger range of motion than I would have expected.  Checking CRP for disease activity monitoring.  Plan to continue hydroxychloroquine 200 mg daily.  High risk medication use - Plan: C-reactive protein, BASIC METABOLIC PANEL WITH GFR  Checking CBC  and basic metabolic panel for monitoring long-term use of hydroxychloroquine.  Discussed need for following up with retinal toxicity eye exam monitoring.  Orders: Orders Placed This Encounter  Procedures   CBC with Differential/Platelet   C-reactive protein   BASIC METABOLIC PANEL WITH GFR   Meds ordered this encounter  Medications   hydroxychloroquine (PLAQUENIL) 200 MG tablet    Sig: Take 1 tablet (200 mg total) by mouth daily.    Dispense:  90 tablet    Refill:  1     Follow-Up Instructions: Return in about 6 months (around 11/30/2022) for RA on HCQ f/u 42mo.   CCollier Salina MD  Note - This record has been created using [employer]  Chart creation errors have been sought, but may not always  have been located. Such creation errors do not reflect on  the standard of medical care.

## 2022-06-01 ENCOUNTER — Ambulatory Visit: Payer: Medicaid Other | Attending: Internal Medicine | Admitting: Internal Medicine

## 2022-06-01 ENCOUNTER — Encounter: Payer: Self-pay | Admitting: Internal Medicine

## 2022-06-01 VITALS — BP 128/90 | HR 115 | Resp 15 | Ht 72.0 in | Wt 213.4 lb

## 2022-06-01 DIAGNOSIS — M059 Rheumatoid arthritis with rheumatoid factor, unspecified: Secondary | ICD-10-CM | POA: Insufficient documentation

## 2022-06-01 DIAGNOSIS — Z79899 Other long term (current) drug therapy: Secondary | ICD-10-CM | POA: Diagnosis present

## 2022-06-01 LAB — CBC WITH DIFFERENTIAL/PLATELET
Basophils Relative: 1.1 %
Lymphs Abs: 2863 cells/uL (ref 850–3900)
MCH: 29.8 pg (ref 27.0–33.0)
MCHC: 33.8 g/dL (ref 32.0–36.0)
MCV: 88 fL (ref 80.0–100.0)
MPV: 10.2 fL (ref 7.5–12.5)
Neutro Abs: 3892 cells/uL (ref 1500–7800)
Neutrophils Relative %: 49.9 %
Platelets: 280 10*3/uL (ref 140–400)
RBC: 5.27 10*6/uL (ref 4.20–5.80)
RDW: 13.1 % (ref 11.0–15.0)
Total Lymphocyte: 36.7 %

## 2022-06-01 MED ORDER — HYDROXYCHLOROQUINE SULFATE 200 MG PO TABS: 200.0000 mg | ORAL_TABLET | Freq: Every day | ORAL | 1 refills | Status: AC

## 2022-06-02 LAB — CBC WITH DIFFERENTIAL/PLATELET
Absolute Monocytes: 733 cells/uL (ref 200–950)
Basophils Absolute: 86 cells/uL (ref 0–200)
Eosinophils Absolute: 226 cells/uL (ref 15–500)
Eosinophils Relative: 2.9 %
HCT: 46.4 % (ref 38.5–50.0)
Hemoglobin: 15.7 g/dL (ref 13.2–17.1)
Monocytes Relative: 9.4 %
WBC: 7.8 10*3/uL (ref 3.8–10.8)

## 2022-06-02 LAB — BASIC METABOLIC PANEL WITH GFR
BUN: 13 mg/dL (ref 7–25)
CO2: 28 mmol/L (ref 20–32)
Calcium: 10.4 mg/dL — ABNORMAL HIGH (ref 8.6–10.3)
Chloride: 100 mmol/L (ref 98–110)
Creat: 1.2 mg/dL (ref 0.70–1.30)
Glucose, Bld: 106 mg/dL — ABNORMAL HIGH (ref 65–99)
Potassium: 5.7 mmol/L — ABNORMAL HIGH (ref 3.5–5.3)
Sodium: 138 mmol/L (ref 135–146)
eGFR: 71 mL/min/{1.73_m2} (ref >=60)

## 2022-06-02 LAB — C-REACTIVE PROTEIN: CRP: 2 mg/L (ref <8.0)

## 2022-06-02 NOTE — Progress Notes (Signed)
Inflammation markers look good. His blood count is normal.  His potassium level is slightly increased. This is not an effect of the medication. Has he been taking any supplement or any seasoning product with potassium?

## 2022-07-02 ENCOUNTER — Encounter: Payer: Self-pay | Admitting: *Deleted

## 2022-10-20 ENCOUNTER — Encounter: Payer: Self-pay | Admitting: Gastroenterology

## 2022-11-15 NOTE — Progress Notes (Deleted)
Referring Provider: Denyce Robert, FNP Primary Care Physician:  Denyce Robert, FNP Primary Gastroenterologist:  Dr. Rayne Du chief complaint on file.   HPI:   Miguel Porter is a 57 y.o. male presenting today at the request of Denyce Robert, [place] for consult colonoscopy. OV due to constipation.     Past Medical History:  Diagnosis Date   Kidney stone    Rheumatoid arthritis [facility])     Past Surgical History:  Procedure Laterality Date   CHOLECYSTECTOMY  01/01/2012   Procedure: LAPAROSCOPIC CHOLECYSTECTOMY;  Surgeon: Jamesetta So, MD;  Location: [facility];  Service: General;  Laterality: N/A;   LITHOTRIPSY      Current Outpatient Medications  Medication Sig Dispense Refill   hydroxychloroquine (PLAQUENIL) 200 MG tablet Take 1 tablet (200 mg total) by mouth daily. 90 tablet 1   ibuprofen (ADVIL,MOTRIN) 200 MG tablet Take 600 mg by mouth every 6 (six) hours as needed for pain.     No current facility-administered medications for this visit.    Allergies as of 11/18/2022   (No Known Allergies)    Family History  Problem Relation Age of Onset   Hypertension Father    Heart Problems Father     Social History   Socioeconomic History   Marital status: Legally Separated    Spouse name: Not on file   Number of children: Not on file   Years of education: Not on file   Highest education level: Not on file  Occupational History   Not on file  Tobacco Use   Smoking status: Former    Packs/day: 1.00    Years: 28.00    Total pack years: 28.00    Types: Cigarettes    Quit date: 11/2019    Years since quitting: 3.0   Smokeless tobacco: Never  Vaping Use   Vaping Use: Every day   Substances: Nicotine  Substance and Sexual Activity   Alcohol use: Yes    Comment: very little, 6 pack beer/year   Drug use: No   Sexual activity: Not on file  Other Topics Concern   Not on file  Social History Narrative   Not on file   Social Determinants of Health   Financial  Resource Strain: Not on file  Food Insecurity: Not on file  Transportation Needs: Not on file  Physical Activity: Not on file  Stress: Not on file  Social Connections: Not on file  Intimate Partner Violence: Not on file    Review of Systems: Gen: Denies any fever, chills, fatigue, weight loss, lack of appetite.  CV: Denies chest pain, heart palpitations, peripheral edema, syncope.  Resp: Denies shortness of breath at rest or with exertion. Denies wheezing or cough.  GI: Denies dysphagia or odynophagia. Denies jaundice, hematemesis, fecal incontinence. GU : Denies urinary burning, urinary frequency, urinary hesitancy MS: Denies joint pain, muscle weakness, cramps, or limitation of movement.  Derm: Denies rash, itching, dry skin Psych: Denies depression, anxiety, memory loss, and confusion Heme: Denies bruising, bleeding, and enlarged lymph nodes.  Physical Exam: There were no vitals taken for this visit. General:   Alert and oriented. Pleasant and cooperative. Well-nourished and well-developed.  Head:  Normocephalic and atraumatic. Eyes:  Without icterus, sclera clear and conjunctiva pink.  Ears:  Normal auditory acuity. Lungs:  Clear to auscultation bilaterally. No wheezes, rales, or rhonchi. No distress.  Heart:  S1, S2 present without murmurs appreciated.  Abdomen:  +BS, soft, non-tender and non-distended. No HSM noted. No  guarding or rebound. No masses appreciated.  Rectal:  Deferred  Msk:  Symmetrical without gross deformities. Normal posture. Extremities:  Without edema. Neurologic:  Alert and  oriented x4;  grossly normal neurologically. Skin:  Intact without significant lesions or rashes. Psych:  Alert and cooperative. Normal mood and affect.    Assessment:     Plan:  ***   Aliene Altes, PA-C [facility] 11/18/2022

## 2022-11-18 ENCOUNTER — Ambulatory Visit: Payer: Medicaid Other | Admitting: Gastroenterology

## 2022-11-18 ENCOUNTER — Encounter: Payer: Self-pay | Admitting: Gastroenterology

## 2022-11-29 NOTE — Progress Notes (Deleted)
Office Visit Note  Patient: Miguel Porter             Date of Birth: [birth year]           MRN: [id number]             PCP: Denyce Robert, [place] Referring: Denyce Robert, FNP Visit Date: 11/30/2022   Subjective:  No chief complaint on file.   History of Present Illness: Miguel Porter is a 57 y.o. male here for follow up ***   Previous HPI 06/01/22  Miguel Porter is a 57 y.o. male here for follow up for seropositive, deforming rheumatoid arthritis on HCQ 200 mg daily. After last visit he started the methotrexate but tolerated the medicine extremely poorly with GI upset and severe fatigue and discontinued this.  Overall his symptoms have been doing reasonably well without severe flareups no new progression of joint changes or deformity.  He is staying pretty active has been able to play basketball for exercise.  He has upcoming plans for multiple tooth extractions.   Previous HPI 10/07/2021 Miguel Porter is a 57 y.o. male here for follow up for seropositive, deforming rheumatoid arthritis after starting hydroxychloroquine 200 mg daily after last visit. He feels overall very poorly with a lot of fatigue and he has gained over 30 pounds unintentionally within about 4 months. He stopped taking the olanzapine prescribed by psychiatry because it was making him feel intoxicated. He has some finger pain in a few joints with occasional swelling.    Previous HPI 06/23/21 Miguel Porter is a 57 y.o. male here for follow up for seropositive, deforming rheumatoid arthritis after initial visit. His xrays showed no erosive disease changes labs were highly positive for RF and CCP, also high ESR and CRP. He feels overall well but does have some left wrist pain and stiffness today.   Previous HPI 06/02/21 Miguel Porter is a 57 y.o. male here for history of RA. He has not been seeing a rheumatologist for his joint inflammation and generally had not been following up with doctors based in part from lack of  insurance until more recently.  Joint pain in numerous sites worse problems affecting his hands typically exacerbations with wintertime.  He has increased difficulty with grip strength and ROM with some catching or sticking particularly right middle finger which has become fixed in a contracted position. Past treatments include prednisone otherwise not sure. He has a high level of stress or anxiety possible bipolar disorder seeing psychiatry and self treats with second hand benzodiazepines.  He does have a history of kidney stones but normal renal function, was never told what kind of stones.   No Rheumatology ROS completed.   PMFS History:  Patient Active Problem List   Diagnosis Date Noted   Seropositive rheumatoid arthritis ([place]) 06/02/2021   Bilateral hand pain 06/02/2021   Low back pain 06/02/2021   Nail dystrophy 06/02/2021   High risk medication use 06/02/2021    Past Medical History:  Diagnosis Date   Kidney stone    Rheumatoid arthritis ([place])     Family History  Problem Relation Age of Onset   Hypertension Father    Heart Problems Father    Past Surgical History:  Procedure Laterality Date   CHOLECYSTECTOMY  01/01/2012   Procedure: LAPAROSCOPIC CHOLECYSTECTOMY;  Surgeon: Jamesetta So, MD;  Location: [facility];  Service: General;  Laterality: N/A;   LITHOTRIPSY     Social History   Social  History Narrative   Not on file   Immunization History  Administered Date(s) Administered   PFIZER(Purple Top)SARS-COV-2 Vaccination 04/15/2020, 05/10/2020     Objective: Vital Signs: There were no vitals taken for this visit.   Physical Exam   Musculoskeletal Exam: ***  CDAI Exam: CDAI Score: -- Patient Global: --; Provider Global: -- Swollen: --; Tender: -- Joint Exam 11/30/2022   No joint exam has been documented for this visit   There is currently no information documented on the homunculus. Go to the Rheumatology activity and complete the homunculus joint  exam.  Investigation: No additional findings.  Imaging: No results found.  Recent Labs: Lab Results  Component Value Date   WBC 7.8 06/01/2022   HGB 15.7 06/01/2022   PLT 280 06/01/2022   NA 138 06/01/2022   K 5.7 (H) 06/01/2022   CL 100 06/01/2022   CO2 28 06/01/2022   GLUCOSE 106 (H) 06/01/2022   BUN 13 06/01/2022   CREATININE 1.20 06/01/2022   BILITOT 0.4 10/07/2021   ALKPHOS 90 01/02/2012   AST 17 10/07/2021   ALT 14 10/07/2021   PROT 7.8 10/07/2021   ALBUMIN 3.2 (L) 01/02/2012   CALCIUM 10.4 (H) 06/01/2022   GFRAA >90 01/02/2012   QFTBGOLDPLUS NEGATIVE 06/02/2021    Speciality Comments: PLQ Eye Exam: 08/03/2022 WNL @ [facility]. Follow up 1 year.   Procedures:  No procedures performed Allergies: Patient has no known allergies.   Assessment / Plan:     Visit Diagnoses: No diagnosis found.  ***  Orders: No orders of the defined types were placed in this encounter.  No orders of the defined types were placed in this encounter.    Follow-Up Instructions: No follow-ups on file.   Collier Salina, MD  Note - This record has been created using [employer].  Chart creation errors have been sought, but may not always  have been located. Such creation errors do not reflect on  the Porter of medical care.

## 2022-11-30 ENCOUNTER — Ambulatory Visit: Payer: Medicaid Other | Admitting: Internal Medicine

## 2023-02-09 ENCOUNTER — Ambulatory Visit: Payer: Medicaid Other | Admitting: Internal Medicine

## 2023-02-09 NOTE — Progress Notes (Deleted)
Office Visit Note  Patient: Miguel Porter             Date of Birth: 11-Sep-1966           MRN: [id number]             PCP: Marylynn Pearson, FNP Referring: Marylynn Pearson, FNP Visit Date: 02/09/2023   Subjective:  No chief complaint on file.   History of Present Illness: Miguel Porter is a 57 y.o. male here for follow up ***   Previous HPI 06/01/22 Miguel Porter is a 57 y.o. male here for follow up for seropositive, deforming rheumatoid arthritis on HCQ 200 mg daily. After last visit he started the methotrexate but tolerated the medicine extremely poorly with GI upset and severe fatigue and discontinued this.  Overall his symptoms have been doing reasonably well without severe flareups no new progression of joint changes or deformity.  He is staying pretty active has been able to play basketball for exercise.  He has upcoming plans for multiple tooth extractions.   Previous HPI 10/07/2021 Miguel Porter is a 57 y.o. male here for follow up for seropositive, deforming rheumatoid arthritis after starting hydroxychloroquine 200 mg daily after last visit. He feels overall very poorly with a lot of fatigue and he has gained over 30 pounds unintentionally within about 4 months. He stopped taking the olanzapine prescribed by psychiatry because it was making him feel intoxicated. He has some finger pain in a few joints with occasional swelling.    Previous HPI 06/23/21 Miguel Porter is a 57 y.o. male here for follow up for seropositive, deforming rheumatoid arthritis after initial visit. His xrays showed no erosive disease changes labs were highly positive for RF and CCP, also high ESR and CRP. He feels overall well but does have some left wrist pain and stiffness today.   Previous HPI 06/02/21 Miguel Porter is a 57 y.o. male here for history of RA. He has not been seeing a rheumatologist for his joint inflammation and generally had not been following up with doctors based in part from lack of  insurance until more recently.  Joint pain in numerous sites worse problems affecting his hands typically exacerbations with wintertime.  He has increased difficulty with grip strength and ROM with some catching or sticking particularly right middle finger which has become fixed in a contracted position. Past treatments include prednisone otherwise not sure. He has a high level of stress or anxiety possible bipolar disorder seeing psychiatry and self treats with second hand benzodiazepines.  He does have a history of kidney stones but normal renal function, was never told what kind of stones.   No Rheumatology ROS completed.   PMFS History:  Patient Active Problem List   Diagnosis Date Noted   Seropositive rheumatoid arthritis (HCC) 06/02/2021   Bilateral hand pain 06/02/2021   Low back pain 06/02/2021   Nail dystrophy 06/02/2021   High risk medication use 06/02/2021    Past Medical History:  Diagnosis Date   Kidney stone    Rheumatoid arthritis (HCC)     Family History  Problem Relation Age of Onset   Hypertension Father    Heart Problems Father    Past Surgical History:  Procedure Laterality Date   CHOLECYSTECTOMY  01/01/2012   Procedure: LAPAROSCOPIC CHOLECYSTECTOMY;  Surgeon: Dalia Heading, MD;  Location: [facility];  Service: General;  Laterality: N/A;   LITHOTRIPSY     Social History   Social History  Narrative   Not on file   Immunization History  Administered Date(s) Administered   PFIZER(Purple Top)SARS-COV-2 Vaccination 04/15/2020, 05/10/2020     Objective: Vital Signs: There were no vitals taken for this visit.   Physical Exam   Musculoskeletal Exam: ***  CDAI Exam: CDAI Score: -- Patient Global: --; Provider Global: -- Swollen: --; Tender: -- Joint Exam 02/09/2023   No joint exam has been documented for this visit   There is currently no information documented on the homunculus. Go to the Rheumatology activity and complete the homunculus joint  exam.  Investigation: No additional findings.  Imaging: No results found.  Recent Labs: Lab Results  Component Value Date   WBC 7.8 06/01/2022   HGB 15.7 06/01/2022   PLT 280 06/01/2022   NA 138 06/01/2022   K 5.7 (H) 06/01/2022   CL 100 06/01/2022   CO2 28 06/01/2022   GLUCOSE 106 (H) 06/01/2022   BUN 13 06/01/2022   CREATININE 1.20 06/01/2022   BILITOT 0.4 10/07/2021   ALKPHOS 90 01/02/2012   AST 17 10/07/2021   ALT 14 10/07/2021   PROT 7.8 10/07/2021   ALBUMIN 3.2 (L) 01/02/2012   CALCIUM 10.4 (H) 06/01/2022   GFRAA >90 01/02/2012   QFTBGOLDPLUS NEGATIVE 06/02/2021    Speciality Comments: PLQ Eye Exam: 08/03/2022 WNL @ [facility]. Follow up 1 year.   Procedures:  No procedures performed Allergies: Patient has no known allergies.   Assessment / Plan:     Visit Diagnoses: No diagnosis found.  ***  Orders: No orders of the defined types were placed in this encounter.  No orders of the defined types were placed in this encounter.    Follow-Up Instructions: No follow-ups on file.   Fuller Plan, MD  Note - This record has been created using [employer].  Chart creation errors have been sought, but may not always  have been located. Such creation errors do not reflect on  the standard of medical care.

## 2023-04-05 ENCOUNTER — Encounter (HOSPITAL_COMMUNITY): Payer: Self-pay | Admitting: Emergency Medicine

## 2023-04-05 ENCOUNTER — Emergency Department (HOSPITAL_COMMUNITY)
Admission: EM | Admit: 2023-04-05 | Discharge: 2023-04-06 | Disposition: A | Discharge status: Other Acute Inpatient Hospital | Payer: MEDICAID | Attending: Emergency Medicine | Admitting: Emergency Medicine

## 2023-04-05 ENCOUNTER — Other Ambulatory Visit: Payer: Self-pay

## 2023-04-05 DIAGNOSIS — F3164 Bipolar disorder, current episode mixed, severe, with psychotic features: Secondary | ICD-10-CM

## 2023-04-05 DIAGNOSIS — R4182 Altered mental status, unspecified: Secondary | ICD-10-CM | POA: Insufficient documentation

## 2023-04-05 DIAGNOSIS — F319 Bipolar disorder, unspecified: Secondary | ICD-10-CM | POA: Insufficient documentation

## 2023-04-05 DIAGNOSIS — R440 Auditory hallucinations: Secondary | ICD-10-CM | POA: Diagnosis not present

## 2023-04-05 DIAGNOSIS — Z79899 Other long term (current) drug therapy: Secondary | ICD-10-CM | POA: Insufficient documentation

## 2023-04-05 DIAGNOSIS — F309 Manic episode, unspecified: Secondary | ICD-10-CM | POA: Insufficient documentation

## 2023-04-05 DIAGNOSIS — F3110 Bipolar disorder, current episode manic without psychotic features, unspecified: Secondary | ICD-10-CM | POA: Insufficient documentation

## 2023-04-05 DIAGNOSIS — F3113 Bipolar disorder, current episode manic without psychotic features, severe: Secondary | ICD-10-CM | POA: Diagnosis not present

## 2023-04-05 LAB — CBC
HCT: 42.6 % (ref 39.0–52.0)
Hemoglobin: 14 g/dL (ref 13.0–17.0)
MCH: 30.3 pg (ref 26.0–34.0)
MCHC: 32.9 g/dL (ref 30.0–36.0)
MCV: 92.2 fL (ref 80.0–100.0)
Platelets: 253 10*3/uL (ref 150–400)
RBC: 4.62 MIL/uL (ref 4.22–5.81)
RDW: 13.3 % (ref 11.5–15.5)
WBC: 9.1 10*3/uL (ref 4.0–10.5)
nRBC: 0 % (ref 0.0–0.2)

## 2023-04-05 LAB — COMPREHENSIVE METABOLIC PANEL
ALT: 31 U/L (ref 0–44)
AST: 34 U/L (ref 15–41)
Albumin: 4.1 g/dL (ref 3.5–5.0)
Alkaline Phosphatase: 94 U/L (ref 38–126)
Anion gap: 9 (ref 5–15)
BUN: 29 mg/dL — ABNORMAL HIGH (ref 6–20)
CO2: 25 mmol/L (ref 22–32)
Calcium: 8.7 mg/dL — ABNORMAL LOW (ref 8.9–10.3)
Chloride: 95 mmol/L — ABNORMAL LOW (ref 98–111)
Creatinine, Ser: 1 mg/dL (ref 0.61–1.24)
GFR, Estimated: >60 mL/min (ref >60)
Glucose, Bld: 142 mg/dL — ABNORMAL HIGH (ref 70–99)
Potassium: 3.7 mmol/L (ref 3.5–5.1)
Sodium: 129 mmol/L — ABNORMAL LOW (ref 135–145)
Total Bilirubin: 0.3 mg/dL (ref 0.3–1.2)
Total Protein: 7.9 g/dL (ref 6.5–8.1)

## 2023-04-05 LAB — RAPID URINE DRUG SCREEN, HOSP PERFORMED
Amphetamines: POSITIVE — AB
Barbiturates: NOT DETECTED
Benzodiazepines: POSITIVE — AB
Cocaine: NOT DETECTED
Opiates: NOT DETECTED
Tetrahydrocannabinol: NOT DETECTED

## 2023-04-05 LAB — ETHANOL: Alcohol, Ethyl (B): <10 mg/dL (ref <10)

## 2023-04-05 LAB — SALICYLATE LEVEL: Salicylate Lvl: <7 mg/dL — ABNORMAL LOW (ref 7.0–30.0)

## 2023-04-05 LAB — ACETAMINOPHEN LEVEL: Acetaminophen (Tylenol), Serum: <10 ug/mL — ABNORMAL LOW (ref 10–30)

## 2023-04-05 NOTE — ED Provider Notes (Signed)
[place] EMERGENCY DEPARTMENT AT [facility] Provider Note   CSN: [id number] Arrival date & time: 04/05/23  1705     History {Add pertinent medical, surgical, social history, OB history to HPI:1} Chief Complaint  Patient presents with   IVC   Hallucinations    Miguel Porter is a 57 y.o. male.  Patient has IVC papers that state he has been having visual and auditory hallucinations.   Altered Mental Status Presenting symptoms: behavior changes   Severity:  Severe Most recent episode:  Today Episode history:  Continuous Timing:  Constant Progression:  Worsening Chronicity:  New Context: not alcohol use   Associated symptoms: no abdominal pain, no hallucinations, no headaches, no rash and no seizures        Home Medications Prior to Admission medications   Medication Sig Start Date End Date Taking? Authorizing Provider  hydroxychloroquine (PLAQUENIL) 200 MG tablet Take 1 tablet (200 mg total) by mouth daily. 06/01/22   Rice, Jamesetta Orleans, MD  ibuprofen (ADVIL,MOTRIN) 200 MG tablet Take 600 mg by mouth every 6 (six) hours as needed for pain.    [provider]      Allergies    Patient has no known allergies.    Review of Systems   Review of Systems  Constitutional:  Negative for appetite change and fatigue.  HENT:  Negative for congestion, ear discharge and sinus pressure.   Eyes:  Negative for discharge.  Respiratory:  Negative for cough.   Cardiovascular:  Negative for chest pain.  Gastrointestinal:  Negative for abdominal pain and diarrhea.  Genitourinary:  Negative for frequency and hematuria.  Musculoskeletal:  Negative for back pain.  Skin:  Negative for rash.  Neurological:  Negative for seizures and headaches.  Psychiatric/Behavioral:  Negative for hallucinations.     Physical Exam Updated Vital Signs BP (!) 142/94 (BP Location: Right Arm)   Pulse 93   Temp 97.6 F (36.4 C) (Oral)   Resp 20   Ht 6' (1.829 m)   Wt 88.9 kg    SpO2 100%   BMI 26.58 kg/m  Physical Exam Vitals and nursing note reviewed.  Constitutional:      Appearance: He is well-developed.  HENT:     Head: Normocephalic.     Nose: Nose normal.  Eyes:     General: No scleral icterus.    Conjunctiva/sclera: Conjunctivae normal.  Neck:     Thyroid: No thyromegaly.  Cardiovascular:     Rate and Rhythm: Normal rate and regular rhythm.     Heart sounds: No murmur heard.    No friction rub. No gallop.  Pulmonary:     Breath sounds: No stridor. No wheezing or rales.  Chest:     Chest wall: No tenderness.  Abdominal:     General: There is no distension.     Tenderness: There is no abdominal tenderness. There is no rebound.  Musculoskeletal:        General: Normal range of motion.     Cervical back: Neck supple.  Lymphadenopathy:     Cervical: No cervical adenopathy.  Skin:    Findings: No erythema or rash.  Neurological:     Mental Status: He is alert.     Motor: No abnormal muscle tone.     Coordination: Coordination normal.     Comments: Patient oriented to person only  Psychiatric:     Comments: Patient having auditory hallucination     ED Results / Procedures / Treatments  Labs (all labs ordered are listed, but only abnormal results are displayed) Labs Reviewed  COMPREHENSIVE METABOLIC PANEL - Abnormal; Notable for the following components:      Result Value   Sodium 129 (*)    Chloride 95 (*)    Glucose, Bld 142 (*)    BUN 29 (*)    Calcium 8.7 (*)    All other components within normal limits  SALICYLATE LEVEL - Abnormal; Notable for the following components:   Salicylate Lvl <7.0 (*)    All other components within normal limits  ACETAMINOPHEN LEVEL - Abnormal; Notable for the following components:   Acetaminophen (Tylenol), Serum <10 (*)    All other components within normal limits  RAPID URINE DRUG SCREEN, HOSP PERFORMED - Abnormal; Notable for the following components:   Benzodiazepines POSITIVE (*)     Amphetamines POSITIVE (*)    All other components within normal limits  ETHANOL  CBC    EKG None  Radiology No results found.  Procedures Procedures  {Document cardiac monitor, telemetry assessment procedure when appropriate:1}  Medications Ordered in ED Medications - No data to display  ED Course/ Medical Decision Making/ A&P   {   Click here for ABCD2, HEART and other calculatorsREFRESH Note before signing :1}                          Medical Decision Making Amount and/or Complexity of Data Reviewed Labs: ordered.   Patient with auditory hallucinations and has been IVC. {Document critical care time when appropriate:1} {Document review of labs and clinical decision tools ie heart score, Chads2Vasc2 etc:1}  {Document your independent review of radiology images, and any outside records:1} {Document your discussion with family members, caretakers, and with consultants:1} {Document social determinants of health affecting pt's care:1} {Document your decision making why or why not admission, treatments were needed:1} Final Clinical Impression(s) / ED Diagnoses Final diagnoses:  None    Rx / DC Orders ED Discharge Orders     None

## 2023-04-05 NOTE — ED Notes (Signed)
Pt dressed into burgundy scrubs and wanded by security. Pt belongings labeled and put in psych locker.

## 2023-04-05 NOTE — ED Notes (Signed)
Pt just sitting on bed calm , not restless, and no hallucinations noted.

## 2023-04-05 NOTE — BH Assessment (Signed)
TTS assessment will be completed by IRIS provider. IRIS Coordinator is Dorathy Daft, [phone]. IRIS provider and assessment time pending. Secure chat initiated.
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
## 2023-04-06 ENCOUNTER — Encounter (HOSPITAL_COMMUNITY): Payer: Self-pay | Admitting: Psychiatry

## 2023-04-06 ENCOUNTER — Inpatient Hospital Stay (HOSPITAL_COMMUNITY)
Admission: AD | Admit: 2023-04-06 | Discharge: 2023-04-11 | DRG: 885 | Disposition: A | Discharge status: Home / Self Care | Payer: MEDICAID | Source: Intra-hospital | Attending: Psychiatry | Admitting: Psychiatry

## 2023-04-06 DIAGNOSIS — F3110 Bipolar disorder, current episode manic without psychotic features, unspecified: Principal | ICD-10-CM | POA: Diagnosis present

## 2023-04-06 DIAGNOSIS — F3163 Bipolar disorder, current episode mixed, severe, without psychotic features: Secondary | ICD-10-CM | POA: Diagnosis present

## 2023-04-06 DIAGNOSIS — G3184 Mild cognitive impairment, so stated: Secondary | ICD-10-CM | POA: Diagnosis present

## 2023-04-06 DIAGNOSIS — E871 Hypo-osmolality and hyponatremia: Secondary | ICD-10-CM | POA: Diagnosis present

## 2023-04-06 DIAGNOSIS — F1310 Sedative, hypnotic or anxiolytic abuse, uncomplicated: Secondary | ICD-10-CM | POA: Diagnosis present

## 2023-04-06 DIAGNOSIS — Z87891 Personal history of nicotine dependence: Secondary | ICD-10-CM | POA: Diagnosis not present

## 2023-04-06 DIAGNOSIS — F13159 Sedative, hypnotic or anxiolytic abuse with sedative, hypnotic or anxiolytic-induced psychotic disorder, unspecified: Secondary | ICD-10-CM | POA: Diagnosis present

## 2023-04-06 DIAGNOSIS — F15159 Other stimulant abuse with stimulant-induced psychotic disorder, unspecified: Secondary | ICD-10-CM | POA: Diagnosis present

## 2023-04-06 DIAGNOSIS — F3113 Bipolar disorder, current episode manic without psychotic features, severe: Secondary | ICD-10-CM

## 2023-04-06 DIAGNOSIS — Z79899 Other long term (current) drug therapy: Secondary | ICD-10-CM | POA: Diagnosis not present

## 2023-04-06 DIAGNOSIS — R7303 Prediabetes: Secondary | ICD-10-CM | POA: Diagnosis present

## 2023-04-06 DIAGNOSIS — F19959 Other psychoactive substance use, unspecified with psychoactive substance-induced psychotic disorder, unspecified: Secondary | ICD-10-CM | POA: Diagnosis present

## 2023-04-06 DIAGNOSIS — M069 Rheumatoid arthritis, unspecified: Secondary | ICD-10-CM | POA: Diagnosis present

## 2023-04-06 DIAGNOSIS — Z635 Disruption of family by separation and divorce: Secondary | ICD-10-CM | POA: Diagnosis not present

## 2023-04-06 DIAGNOSIS — F319 Bipolar disorder, unspecified: Secondary | ICD-10-CM | POA: Diagnosis not present

## 2023-04-06 LAB — BASIC METABOLIC PANEL
Anion gap: 6 (ref 5–15)
BUN: 19 mg/dL (ref 6–20)
CO2: 25 mmol/L (ref 22–32)
Calcium: 8.4 mg/dL — ABNORMAL LOW (ref 8.9–10.3)
Chloride: 102 mmol/L (ref 98–111)
Creatinine, Ser: 0.82 mg/dL (ref 0.61–1.24)
GFR, Estimated: >60 mL/min (ref >60)
Glucose, Bld: 101 mg/dL — ABNORMAL HIGH (ref 70–99)
Potassium: 3.6 mmol/L (ref 3.5–5.1)
Sodium: 133 mmol/L — ABNORMAL LOW (ref 135–145)

## 2023-04-06 MED ORDER — QUETIAPINE FUMARATE 25 MG PO TABS: 50.0000 mg | ORAL_TABLET | Freq: Every day | ORAL | Status: DC

## 2023-04-06 MED ORDER — HYDROXYZINE HCL 25 MG PO TABS
25.0000 mg | ORAL_TABLET | Freq: Three times a day (TID) | ORAL | Status: DC | PRN
  Administered 2023-04-07 – 2023-04-11 (×4): 25 mg via ORAL
  Filled 2023-04-06 (×4): qty 1

## 2023-04-06 MED ORDER — HALOPERIDOL 5 MG PO TABS: 5.0000 mg | ORAL_TABLET | Freq: Three times a day (TID) | ORAL | Status: DC | PRN

## 2023-04-06 MED ORDER — ALUM & MAG HYDROXIDE-SIMETH 200-200-20 MG/5ML PO SUSP: 30.0000 mL | ORAL | Status: DC | PRN

## 2023-04-06 MED ORDER — MAGNESIUM HYDROXIDE 400 MG/5ML PO SUSP: 30.0000 mL | Freq: Every day | ORAL | Status: DC | PRN

## 2023-04-06 MED ORDER — ACETAMINOPHEN 325 MG PO TABS: 650.0000 mg | ORAL_TABLET | Freq: Four times a day (QID) | ORAL | Status: DC | PRN

## 2023-04-06 MED ORDER — LORAZEPAM 2 MG/ML IJ SOLN: 2.0000 mg | Freq: Three times a day (TID) | INTRAMUSCULAR | Status: DC | PRN

## 2023-04-06 MED ORDER — DIPHENHYDRAMINE HCL 50 MG/ML IJ SOLN: 50.0000 mg | Freq: Three times a day (TID) | INTRAMUSCULAR | Status: DC | PRN

## 2023-04-06 MED ORDER — QUETIAPINE FUMARATE 25 MG PO TABS
25.0000 mg | ORAL_TABLET | Freq: Two times a day (BID) | ORAL | Status: DC
  Administered 2023-04-06 – 2023-04-07 (×2): 25 mg via ORAL
  Filled 2023-04-06 (×5): qty 1

## 2023-04-06 MED ORDER — QUETIAPINE FUMARATE 50 MG PO TABS
50.0000 mg | ORAL_TABLET | Freq: Every day | ORAL | Status: DC
  Administered 2023-04-06: 50 mg via ORAL
  Filled 2023-04-06 (×3): qty 1

## 2023-04-06 MED ORDER — HALOPERIDOL LACTATE 5 MG/ML IJ SOLN: 5.0000 mg | Freq: Three times a day (TID) | INTRAMUSCULAR | Status: DC | PRN

## 2023-04-06 MED ORDER — QUETIAPINE FUMARATE 25 MG PO TABS
25.0000 mg | ORAL_TABLET | Freq: Two times a day (BID) | ORAL | Status: DC
  Administered 2023-04-06 (×2): 25 mg via ORAL
  Filled 2023-04-06 (×2): qty 1

## 2023-04-06 MED ORDER — LORAZEPAM 1 MG PO TABS: 2.0000 mg | ORAL_TABLET | Freq: Three times a day (TID) | ORAL | Status: DC | PRN

## 2023-04-06 MED ORDER — DIPHENHYDRAMINE HCL 25 MG PO CAPS: 50.0000 mg | ORAL_CAPSULE | Freq: Three times a day (TID) | ORAL | Status: DC | PRN
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
## 2023-04-06 NOTE — ED Notes (Signed)
Faxed IVC papers to [facility] @ fax # [phone]

## 2023-04-06 NOTE — Progress Notes (Signed)
Admission Note: Patient is a 57 year old male admitted to the unit under IVC status from [facility] auditory and visual hallucinations.  IVC initiated by fiancee.  Per reports: patient has been talking to himself, hyper religious and has been acting erratically.  UDS positive for Benzos and Amphetamines.  Stated, "I don't know why I'm here."  Patient presents with a flat affect and depressed mood.  Admission plan of care reviewed, consent signed.  Skin and personal belongings completed.  Skin is dry and intact.  No contraband found.  Patient oriented to the unit, staff and room.  Routine safety checks initiated.  Verbalizes understanding of unit rules/protocols.  Patient is safe on the unit.

## 2023-04-06 NOTE — Group Note (Signed)
Date:  04/06/2023 Time:  9:31 PM  Group Topic/Focus:  Wrap-Up Group:   The focus of this group is to help patients review their daily goal of treatment and discuss progress on daily workbooks.    Participation Level:  Did Not Attend   Scot Dock 04/06/2023, 9:31 PM

## 2023-04-06 NOTE — ED Provider Notes (Addendum)
Emergency Medicine Observation Re-evaluation Note  Miguel Porter is a 57 y.o. male, seen on rounds today.  Pt initially presented to the ED for complaints of IVC.  Pt w ? Hx bipolar disorder. UDS + amphetamines. No new c/o this AM.  Physical Exam  BP (!) 142/94 (BP Location: Right Arm)   Pulse 93   Temp 97.6 F (36.4 C) (Oral)   Resp 20   Ht 1.829 m (6')   Wt 88.9 kg   SpO2 100%   BMI 26.58 kg/m  Physical Exam General: resting. Cardiac: regular rate.  Lungs: breathing comfortably. Psych: calm.  ED Course / MDM    I have reviewed the labs performed to date as well as medications administered while in observation.  Recent changes in the last 24 hours include ED obs, med management, reassessment.   Plan  BH team has restarted seroquel and current plan is for inpatient treatment.   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   Pt accepted to [facility], Dr Sherron Flemings.  Pt currently appears stable for transfer/transport.      Cathren Laine, MD 04/06/23 [phone]

## 2023-04-06 NOTE — Progress Notes (Signed)
   04/06/23 2300  Psych Admission Type (Psych Patients Only)  Admission Status Involuntary  Psychosocial Assessment  Patient Complaints Substance abuse  Eye Contact Brief  Facial Expression Flat  Affect Appropriate to circumstance  Speech Logical/coherent  Interaction Minimal  Motor Activity Slow  Appearance/Hygiene In scrubs  Behavior Characteristics Cooperative  Mood Depressed  Thought Process  Coherency WDL  Content WDL  Delusions None reported or observed  Perception WDL  Hallucination None reported or observed  Judgment Impaired  Confusion None  Danger to Self  Current suicidal ideation? Denies (denies)  Danger to Others  Danger to Others None reported or observed

## 2023-04-06 NOTE — Tx Team (Signed)
Initial Treatment Plan 04/06/2023 6:42 PM Miguel Porter [id number]    PATIENT STRESSORS: Health problems   Marital or family conflict   Substance abuse     PATIENT STRENGTHS: Ability for insight  Supportive family/friends    PATIENT IDENTIFIED PROBLEMS: Substance Abuse  Auditory/visual hallucinations  Hyper religious  Family conflict               DISCHARGE CRITERIA:  Adequate post-discharge living arrangements Safe-care adequate arrangements made  PRELIMINARY DISCHARGE PLAN: Attend aftercare/continuing care group Attend 12-step recovery group Outpatient therapy Return to previous living arrangement  PATIENT/FAMILY INVOLVEMENT: This treatment plan has been presented to and reviewed with the patient, Miguel Porter, and/or family member.  The patient and family have been given the opportunity to ask questions and make suggestions.  Clarene Critchley, RN 04/06/2023, 6:42 PM

## 2023-04-06 NOTE — Progress Notes (Signed)
Pt has been accepted to [facility] TODAY 04/06/2023. Bed assignment: 506-1  Pt meets inpatient criteria per Assunta Found, NP  Attending Physician will be Phineas Inches, MD  Report can be called to: - Adult unit: [phone]  Pt can arrive after pending discharges  Care Team Notified: [facility] Malva Limes, RN, Vernard Gambles, NP, Assunta Found, NP, Myrlene Broker, RN, Starr Lake, RN, and Nelly Rout, MD  Cathie Beams, LCSW  04/06/2023 9:26 AM

## 2023-04-06 NOTE — ED Notes (Signed)
Pt in bed emitting a snoring like noise.

## 2023-04-06 NOTE — BH Assessment (Signed)
Miguel Shiver, NP, patient meets inpatient criteria. Per secure chat, Lissa Hoard at [facility], patient accepted pending morning discharges.

## 2023-04-06 NOTE — ED Notes (Signed)
Meal tray given 

## 2023-04-06 NOTE — ED Notes (Signed)
Pt in bed, belongings given to PD, PD at bedside to transport pt.  Pt denies pain, pt has no requests before transport.  Pt ambulatory from department.

## 2023-04-06 NOTE — Group Note (Unsigned)
Date:  04/06/2023 Time:  8:32 PM  Group Topic/Focus:  Wrap-Up Group:   The focus of this group is to help patients review their daily goal of treatment and discuss progress on daily workbooks.     Participation Level:  {BHH PARTICIPATION UEAVW:09811}  Participation Quality:  {BHH PARTICIPATION QUALITY:22265}  Affect:  {BHH AFFECT:22266}  Cognitive:  {BHH COGNITIVE:22267}  Insight: {BHH Insight2:20797}  Engagement in Group:  {BHH ENGAGEMENT IN BJYNW:29562}  Modes of Intervention:  {BHH MODES OF INTERVENTION:22269}  Additional Comments:  ***  Scot Dock 04/06/2023, 8:32 PM

## 2023-04-06 NOTE — ED Notes (Addendum)
Pt TTS at 0134

## 2023-04-07 ENCOUNTER — Encounter (HOSPITAL_COMMUNITY): Payer: Self-pay

## 2023-04-07 DIAGNOSIS — F19959 Other psychoactive substance use, unspecified with psychoactive substance-induced psychotic disorder, unspecified: Principal | ICD-10-CM | POA: Diagnosis present

## 2023-04-07 MED ORDER — VITAMIN B-1 100 MG PO TABS
100.0000 mg | ORAL_TABLET | Freq: Every day | ORAL | Status: DC
  Administered 2023-04-08 – 2023-04-11 (×4): 100 mg via ORAL
  Filled 2023-04-07 (×7): qty 1

## 2023-04-07 MED ORDER — METHOCARBAMOL 500 MG PO TABS
500.0000 mg | ORAL_TABLET | Freq: Three times a day (TID) | ORAL | Status: DC | PRN
  Administered 2023-04-09 – 2023-04-11 (×2): 500 mg via ORAL
  Filled 2023-04-07 (×2): qty 1

## 2023-04-07 MED ORDER — QUETIAPINE FUMARATE ER 50 MG PO TB24
100.0000 mg | ORAL_TABLET | Freq: Every day | ORAL | Status: DC
  Administered 2023-04-07 – 2023-04-09 (×3): 100 mg via ORAL
  Filled 2023-04-07 (×6): qty 2

## 2023-04-07 MED ORDER — LORAZEPAM 1 MG PO TABS
1.0000 mg | ORAL_TABLET | Freq: Four times a day (QID) | ORAL | Status: AC | PRN
  Administered 2023-04-10: 1 mg via ORAL
  Filled 2023-04-07: qty 1

## 2023-04-07 MED ORDER — CLONIDINE HCL 0.1 MG PO TABS
0.1000 mg | ORAL_TABLET | ORAL | Status: DC | PRN
  Administered 2023-04-10 – 2023-04-11 (×2): 0.1 mg via ORAL
  Filled 2023-04-07 (×2): qty 1

## 2023-04-07 MED ORDER — ADULT MULTIVITAMIN W/MINERALS CH
1.0000 | ORAL_TABLET | Freq: Every day | ORAL | Status: DC
  Administered 2023-04-07 – 2023-04-11 (×5): 1 via ORAL
  Filled 2023-04-07 (×9): qty 1

## 2023-04-07 MED ORDER — NICOTINE 14 MG/24HR TD PT24
14.0000 mg | MEDICATED_PATCH | Freq: Every day | TRANSDERMAL | Status: DC
  Administered 2023-04-08 – 2023-04-11 (×4): 14 mg via TRANSDERMAL
  Filled 2023-04-07 (×8): qty 1

## 2023-04-07 MED ORDER — LOPERAMIDE HCL 2 MG PO CAPS: 2.0000 mg | ORAL_CAPSULE | ORAL | Status: DC | PRN

## 2023-04-07 MED ORDER — NAPROXEN 500 MG PO TABS: 500.0000 mg | ORAL_TABLET | Freq: Two times a day (BID) | ORAL | Status: DC | PRN

## 2023-04-07 MED ORDER — ONDANSETRON 4 MG PO TBDP: 4.0000 mg | ORAL_TABLET | Freq: Four times a day (QID) | ORAL | Status: DC | PRN

## 2023-04-07 MED ORDER — DICYCLOMINE HCL 20 MG PO TABS: 20.0000 mg | ORAL_TABLET | Freq: Four times a day (QID) | ORAL | Status: DC | PRN

## 2023-04-07 NOTE — Progress Notes (Signed)
Recreation Therapy Notes  INPATIENT RECREATION THERAPY ASSESSMENT  Patient Details Name: Miguel Porter MRN: [id number] DOB: [birth year] Today's Date: 04/07/2023       Information Obtained From: Patient  Able to Participate in Assessment/Interview: Yes  Patient Presentation: Alert Lenoard Aden)  Reason for Admission (Per Patient): Other (Comments) (per chart: auditory and visual hallucinations)  Patient Stressors: Other (Comment) (None identified)  Coping Skills:   Music  Leisure Interests (2+):  Sports - Basketball ("all sports")  Frequency of Recreation/Participation: Weekly  Awareness of Community Resources:  Yes  Community Resources:  Library, [employer]  Current Use: Yes  If no, Barriers?:    Expressed Interest in [employer] Information: No  [employer] of Residence:  [place]  Patient Main Form of Transportation: Set designer  Patient Strengths:  "don't know right now, I don't know why I'm here"  Patient Identified Areas of Improvement:  "No, I don't think so"  Patient Goal for Hospitalization:  "I don't have one"  Current SI (including self-harm):  No  Current HI:  No  Current AVH: No  Staff Intervention Plan: Group Attendance, Collaborate with Interdisciplinary Treatment Team  Consent to Intern Participation: N/A   Brynlei Klausner-McCall, LRT,CTRS Rachael Ferrie A Tenita Cue-McCall 04/07/2023, 11:59 AM

## 2023-04-07 NOTE — BHH Group Notes (Signed)
Adult Psychoeducational Group Note  Date:  04/07/2023 Time:  9:05 PM  Group Topic/Focus:  Wrap-Up Group:   The focus of this group is to help patients review their daily goal of treatment and discuss progress on daily workbooks.  Participation Level:  Active  Participation Quality:  Appropriate  Affect:  Appropriate  Cognitive:  Appropriate  Insight: Appropriate  Engagement in Group:  Engaged  Modes of Intervention:  Discussion  Additional Comments: Pt. Attended group, stated day was a 8 out of 19, because they got up and came out the room for group,   Joselyn Arrow 04/07/2023, 9:05 PM

## 2023-04-07 NOTE — Group Note (Signed)
Date:  04/07/2023 Time:  9:53 AM  Group Topic/Focus:  Goals Group:   The focus of this group is to help patients establish daily goals to achieve during treatment and discuss how the patient can incorporate goal setting into their daily lives to aide in recovery.    Participation Level:  Did Not Attend  Additional Comments:  Patient was encouraged to attend group multiple times.   Graylin Sperling T Lorraine Lax 04/07/2023, 9:53 AM
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
## 2023-04-07 NOTE — Plan of Care (Signed)
  Problem: Education: Goal: Knowledge of [facility] Education information/materials will improve Outcome: Progressing Goal: Emotional status will improve Outcome: Progressing Goal: Mental status will improve Outcome: Progressing Goal: Verbalization of understanding the information provided will improve Outcome: Progressing   Problem: Activity: Goal: Interest or engagement in activities will improve Outcome: Progressing Goal: Sleeping patterns will improve Outcome: Progressing   Problem: Coping: Goal: Ability to verbalize frustrations and anger appropriately will improve Outcome: Progressing Goal: Ability to demonstrate self-control will improve Outcome: Progressing   Problem: Health Behavior/Discharge Planning: Goal: Identification of resources available to assist in meeting health care needs will improve Outcome: Progressing Goal: Compliance with treatment plan for underlying cause of condition will improve Outcome: Progressing   Problem: Physical Regulation: Goal: Ability to maintain clinical measurements within normal limits will improve Outcome: Progressing   Problem: Safety: Goal: Periods of time without injury will increase Outcome: Progressing   Problem: Education: Goal: Knowledge of [facility] Education information/materials will improve Outcome: Progressing Goal: Emotional status will improve Outcome: Progressing Goal: Mental status will improve Outcome: Progressing Goal: Verbalization of understanding the information provided will improve Outcome: Progressing   Problem: Activity: Goal: Interest or engagement in activities will improve Outcome: Progressing Goal: Sleeping patterns will improve Outcome: Progressing   Problem: Coping: Goal: Ability to verbalize frustrations and anger appropriately will improve Outcome: Progressing Goal: Ability to demonstrate self-control will improve Outcome: Progressing   Problem: Health  Behavior/Discharge Planning: Goal: Identification of resources available to assist in meeting health care needs will improve Outcome: Progressing Goal: Compliance with treatment plan for underlying cause of condition will improve Outcome: Progressing   Problem: Physical Regulation: Goal: Ability to maintain clinical measurements within normal limits will improve Outcome: Progressing   Problem: Safety: Goal: Periods of time without injury will increase Outcome: Progressing

## 2023-04-07 NOTE — BH IP Treatment Plan (Signed)
Interdisciplinary Treatment and Diagnostic Plan Initial  04/07/2023 Time of Session: 1145 Miguel Porter MRN: [id number]  Principal Diagnosis: Psychoactive substance-induced psychosis (HCC)  Secondary Diagnoses: Principal Problem:   Psychoactive substance-induced psychosis (HCC) Active Problems:   Bipolar disorder, manic (HCC)   Current Medications:  Current Facility-Administered Medications  Medication Dose Route Frequency Provider Last Rate Last Admin   acetaminophen (TYLENOL) tablet 650 mg  650 mg Oral Q6H PRN Ardis Hughs, NP       alum & mag hydroxide-simeth (MAALOX/MYLANTA) 200-200-20 MG/5ML suspension 30 mL  30 mL Oral Q4H PRN Ardis Hughs, NP       cloNIDine (CATAPRES) tablet 0.1 mg  0.1 mg Oral Q4H PRN Margaretmary Dys, MD       dicyclomine (BENTYL) tablet 20 mg  20 mg Oral Q6H PRN Margaretmary Dys, MD       diphenhydrAMINE (BENADRYL) capsule 50 mg  50 mg Oral TID PRN Ardis Hughs, NP       Or   diphenhydrAMINE (BENADRYL) injection 50 mg  50 mg Intramuscular TID PRN Ardis Hughs, NP       haloperidol (HALDOL) tablet 5 mg  5 mg Oral TID PRN Ardis Hughs, NP       Or   haloperidol lactate (HALDOL) injection 5 mg  5 mg Intramuscular TID PRN Ardis Hughs, NP       hydrOXYzine (ATARAX) tablet 25 mg  25 mg Oral TID PRN Ardis Hughs, NP       loperamide (IMODIUM) capsule 2-4 mg  2-4 mg Oral PRN Margaretmary Dys, MD       LORazepam (ATIVAN) tablet 2 mg  2 mg Oral TID PRN Ardis Hughs, NP       Or   LORazepam (ATIVAN) injection 2 mg  2 mg Intramuscular TID PRN Ardis Hughs, NP       LORazepam (ATIVAN) tablet 1 mg  1 mg Oral Q6H PRN Margaretmary Dys, MD       magnesium hydroxide (MILK OF MAGNESIA) suspension 30 mL  30 mL Oral Daily PRN Ardis Hughs, NP       methocarbamol (ROBAXIN) tablet 500 mg  500 mg Oral Q8H PRN Margaretmary Dys, MD        multivitamin with minerals tablet 1 tablet  1 tablet Oral Daily Margaretmary Dys, MD   1 tablet at 04/07/23 1040   naproxen (NAPROSYN) tablet 500 mg  500 mg Oral BID PRN Margaretmary Dys, MD       ondansetron (ZOFRAN-ODT) disintegrating tablet 4 mg  4 mg Oral Q6H PRN Margaretmary Dys, MD       QUEtiapine (SEROQUEL XR) 24 hr tablet 100 mg  100 mg Oral QHS Margaretmary Dys, MD       [START ON 04/08/2023] thiamine (Vitamin B-1) tablet 100 mg  100 mg Oral Daily Margaretmary Dys, MD       PTA Medications: Medications Prior to Admission  Medication Sig Dispense Refill Last Dose   hydroxychloroquine (PLAQUENIL) 200 MG tablet Take 1 tablet (200 mg total) by mouth daily. (Patient not taking: Reported on 04/06/2023) 90 tablet 1    ibuprofen (ADVIL,MOTRIN) 200 MG tablet Take 600 mg by mouth every 6 (six) hours as needed for pain.       Patient Stressors: Health problems   Marital or family conflict   Substance abuse  Patient Strengths: Ability for insight  Supportive family/friends   Treatment Modalities: Medication Management, Group therapy, Case management,  1 to 1 session with clinician, Psychoeducation, Recreational therapy.   Physician Treatment Plan for Primary Diagnosis: Psychoactive substance-induced psychosis (HCC) Long Term Goal(s):     Short Term Goals:    Medication Management: Evaluate patient's response, side effects, and tolerance of medication regimen.  Therapeutic Interventions: 1 to 1 sessions, Unit Group sessions and Medication administration.  Evaluation of Outcomes: Progressing  Physician Treatment Plan for Secondary Diagnosis: Principal Problem:   Psychoactive substance-induced psychosis (HCC) Active Problems:   Bipolar disorder, manic (HCC)  Long Term Goal(s):     Short Term Goals:       Medication Management: Evaluate patient's response, side effects, and tolerance of medication  regimen.  Therapeutic Interventions: 1 to 1 sessions, Unit Group sessions and Medication administration.  Evaluation of Outcomes: Progressing   RN Treatment Plan for Primary Diagnosis: Psychoactive substance-induced psychosis (HCC) Long Term Goal(s): Knowledge of disease and therapeutic regimen to maintain health will improve  Short Term Goals: Ability to remain free from injury will improve, Ability to verbalize frustration and anger appropriately will improve, Ability to demonstrate self-control, Ability to participate in decision making will improve, Ability to verbalize feelings will improve, Ability to disclose and discuss suicidal ideas, Ability to identify and develop effective coping behaviors will improve, and Compliance with prescribed medications will improve  Medication Management: RN will administer medications as ordered by provider, will assess and evaluate patient's response and provide education to patient for prescribed medication. RN will report any adverse and/or side effects to prescribing provider.  Therapeutic Interventions: 1 on 1 counseling sessions, Psychoeducation, Medication administration, Evaluate responses to treatment, Monitor vital signs and CBGs as ordered, Perform/monitor CIWA, COWS, AIMS and Fall Risk screenings as ordered, Perform wound care treatments as ordered.  Evaluation of Outcomes: Progressing   LCSW Treatment Plan for Primary Diagnosis: Psychoactive substance-induced psychosis (HCC) Long Term Goal(s): Safe transition to appropriate next level of care at discharge, Engage patient in therapeutic group addressing interpersonal concerns.  Short Term Goals: Engage patient in aftercare planning with referrals and resources, Increase social support, Increase ability to appropriately verbalize feelings, Increase emotional regulation, Facilitate acceptance of mental health diagnosis and concerns, Facilitate patient progression through stages of change  regarding substance use diagnoses and concerns, Identify triggers associated with mental health/substance abuse issues, and Increase skills for wellness and recovery  Therapeutic Interventions: Assess for all discharge needs, 1 to 1 time with Social worker, Explore available resources and support systems, Assess for adequacy in community support network, Educate family and significant other(s) on suicide prevention, Complete Psychosocial Assessment, Interpersonal group therapy.  Evaluation of Outcomes: Progressing   Progress in Treatment: Attending groups: Yes. Participating in groups: Yes. Taking medication as prescribed: Yes. Toleration medication: Yes. Family/Significant other contact made: No. Declined consents Patient understands diagnosis: Yes. Discussing patient identified problems/goals with staff: Yes. Medical problems stabilized or resolved: Yes. Denies suicidal/homicidal ideation: Yes. Issues/concerns per patient self-inventory: Yes. Other: N/A  New problem(s) identified: No, Describe:  None reported  New Short Term/Long Term Goal(s): medication stabilization, elimination of SI thoughts, development of comprehensive mental wellness plan.   Patient Goals:  Medication Stabilization  Discharge Plan or Barriers: Patient recently admitted. CSW will continue to follow and assess for appropriate referrals and possible discharge planning.   Reason for Continuation of Hospitalization: Delusions  Mania Medication stabilization Withdrawal symptoms  Estimated Length of Stay: 5-7 Days  Last 3 [place]  Suicide Severity Risk Score: Flowsheet Row [facility]  C-SSRS RISK CATEGORY No Risk No Risk       Last PHQ 2/9 Scores:     No data to display         detox, medication management for mood stabilization; elimination of SI  thoughts; development of comprehensive mental wellness/sobriety plan   Scribe for Treatment Team: Ane Payment, LCSW 04/07/2023 2:37 PM

## 2023-04-07 NOTE — Group Note (Signed)
Recreation Therapy Group Note   Group Topic:Leisure Education  Group Date: 04/07/2023 Start Time: 1000 End Time: 1030 Facilitators: Keyston Ardolino-McCall, LRT,CTRS Location: [address] Dayroom   Goal Area(s) Addresses:  Patient will successfully identify positive leisure and recreation activities.  Patient will acknowledge benefits of participation in healthy leisure activities post discharge.  Patient will actively work with peers toward a shared goal.    Group Description: Pictionary. In groups of 5-7, patients took turns trying to guess the picture being drawn on the board by their teammate.  If the team guessed the correct answer, they won a point.  If the team guessed wrong, the other team got a chance to steal the point. After several rounds of game play, the team with the most points were declared winners. Post-activity discussion reviewed benefits of positive recreation outlets: reducing stress, improving coping mechanisms, increasing self-esteem, and building larger support systems.   Affect/Mood: N/A   Participation Level: Did not attend    Clinical Observations/Individualized Feedback:     Plan: Continue to engage patient in RT group sessions 2-3x/week.   Angeleigh Chiasson-McCall, LRT,CTRS 04/07/2023 11:18 AM

## 2023-04-07 NOTE — BHH Counselor (Signed)
Adult Comprehensive Assessment  Patient ID: PHI AVANS, male   DOB: 09-17-1965, 57 y.o.   MRN: [id number]  Information Source: Information source: Patient  Current Stressors:  Patient states their primary concerns and needs for treatment are:: Pt states that his girlfriend called the cops after they got into an arugment. Patient states their goals for this hospitilization and ongoing recovery are:: "Going through the emotions to get back home." Educational / Learning stressors: none reported Employment / Job issues: none reported Family Relationships: none reported Surveyor, quantity / Lack of resources (include bankruptcy): none reported Housing / Lack of housing: none reported Physical health (include injuries & life threatening diseases): none reported Social relationships: none reported Substance abuse: none reported Bereavement / Loss: none reported  Living/Environment/Situation:  Living Arrangements: Spouse/significant other Living conditions (as described by patient or guardian): Pt states his living condtions are loving and comfortable. Who else lives in the home?: Pt states he lives with his Girlfriend. How long has patient lived in current situation?: 4 years What is atmosphere in current home: Comfortable, Paramedic  Family History:  Marital status: Long term relationship Long term relationship, how long?: 19 years What types of issues is patient dealing with in the relationship?: none reported Additional relationship information: none reported Are you sexually active?: No What is your sexual orientation?: straight Has your sexual activity been affected by drugs, alcohol, medication, or emotional stress?: none reported Does patient have children?: Yes How many children?: 2 How is patient's relationship with their children?: Pt states he has 2 daurgthers but only speaks to one of them.  Childhood History:  By whom was/is the patient raised?: Both parents Additional childhood  history information: None reported Description of patient's relationship with caregiver when they were a child: Great Patient's description of current relationship with people who raised him/her: great How were you disciplined when you got in trouble as a child/adolescent?: Whoopings Does patient have siblings?: Yes Number of Siblings: 4 Description of patient's current relationship with siblings: Pt states he has a relatiosnhip with all his siblings. Did patient suffer any verbal/emotional/physical/sexual abuse as a child?: No Did patient suffer from severe childhood neglect?: No Has patient ever been sexually abused/assaulted/raped as an adolescent or adult?: No Was the patient ever a victim of a crime or a disaster?: No Witnessed domestic violence?: No Has patient been affected by domestic violence as an adult?: No  Education:  Highest grade of school patient has completed: GED Currently a Consulting civil engineer?: No Learning disability?: No  Employment/Work Situation:   Employment Situation: Unemployed Patient's Job has Been Impacted by Current Illness: No What is the Longest Time Patient has Held a Job?: Pt was unsure Where was the Patient Employed at that Time?: Pt was unsure Has Patient ever Been in the [employer]?: No  Financial Resources:   Financial resources: Receives SSI Does patient have a Lawyer or guardian?: No  Alcohol/Substance Abuse:   What has been your use of drugs/alcohol within the last 12 months?: Pt denies. If attempted suicide, did drugs/alcohol play a role in this?: No Alcohol/Substance Abuse Treatment Hx: Denies past history If yes, describe treatment: Pt states this is their first hospitalization. Has alcohol/substance abuse ever caused legal problems?: No  Social Support System:   Patient's Community Support System: Good Describe Community Support System: Pt states his family is a good support system, parents, siblings, and wife. Type of  faith/religion: Methodist How does patient's faith help to cope with current illness?: Pt states he goes to  church, reads his bible and prays.  Leisure/Recreation:   Do You Have Hobbies?: Yes Leisure and Hobbies: Playing sports and playing video games.  Strengths/Needs:   What is the patient's perception of their strengths?: Playing sports Patient states they can use these personal strengths during their treatment to contribute to their recovery: Pt states he is not sure. Patient states these barriers may affect/interfere with their treatment: none reported Patient states these barriers may affect their return to the community: none reported Other important information patient would like considered in planning for their treatment: none reported  Discharge Plan:   Currently receiving community mental health services: No Patient states concerns and preferences for aftercare planning are: none reported Patient states they will know when they are safe and ready for discharge when: none reported Does patient have access to transportation?: No Does patient have financial barriers related to discharge medications?: No Patient description of barriers related to discharge medications: none reported Plan for no access to transportation at discharge: none reported Will patient be returning to same living situation after discharge?: Yes  Summary/Recommendations:   Summary and Recommendations (to be completed by the evaluator): Pt is a 57 year old male who was IVC due to auditory and visual hallucinations. However, Pt states he was here due to his girlfriend calling the cops on him after an argument and she was scared. Pt denies any stressors and plans to return to his apartment with girlfreind after discharge. Pt does not have any community mental health services at the moment and states this is his first hospitalization. Pt was displaying symtoms like laughing to himself, talking to himself, confused  about certain information and poor eye contact.While here, Miguel Porter can benefit from crisis stabilization, medication management, therapeutic milieu, and referrals for services.   Miguel Porter . 04/07/2023

## 2023-04-07 NOTE — Progress Notes (Signed)
   04/07/23 1133  Psych Admission Type (Psych Patients Only)  Admission Status Involuntary  Psychosocial Assessment  Patient Complaints None  Eye Contact Brief  Facial Expression Flat  Affect Appropriate to circumstance  Speech Logical/coherent  Interaction Minimal  Motor Activity Slow  Appearance/Hygiene In scrubs  Behavior Characteristics Cooperative  Mood Depressed  Thought Process  Coherency WDL  Content WDL  Delusions None reported or observed  Perception WDL  Hallucination None reported or observed  Judgment Poor  Confusion None  Danger to Self  Current suicidal ideation? Denies  Danger to Others  Danger to Others None reported or observed

## 2023-04-07 NOTE — Progress Notes (Signed)
   04/07/23 2200  Psych Admission Type (Psych Patients Only)  Admission Status Involuntary  Psychosocial Assessment  Patient Complaints None  Eye Contact Brief  Facial Expression Flat  Affect Appropriate to circumstance  Speech Logical/coherent  Interaction Minimal  Motor Activity Slow  Appearance/Hygiene In scrubs  Behavior Characteristics Cooperative;Appropriate to situation  Mood Depressed  Thought Process  Coherency WDL  Content WDL  Delusions None reported or observed  Perception WDL  Hallucination None reported or observed  Judgment Poor  Confusion None  Danger to Self  Current suicidal ideation? Denies  Danger to Others  Danger to Others None reported or observed

## 2023-04-08 LAB — LIPID PANEL
Cholesterol: 166 mg/dL (ref 0–200)
HDL: 53 mg/dL (ref >40)
LDL Cholesterol: 105 mg/dL — ABNORMAL HIGH (ref 0–99)
Triglycerides: 40 mg/dL (ref <150)
VLDL: 8 mg/dL (ref 0–40)

## 2023-04-08 LAB — HEMOGLOBIN A1C
Hgb A1c MFr Bld: 6.3 % — ABNORMAL HIGH (ref 4.8–5.6)
Mean Plasma Glucose: 134.11 mg/dL

## 2023-04-08 NOTE — Group Note (Signed)
Occupational Therapy Group Note  Group Topic: Sleep Hygiene  Group Date: 04/08/2023 Start Time: 1430 End Time: 1503 Facilitators: Ted Mcalpine, OT   Group Description: Group encouraged increased participation and engagement through topic focused on sleep hygiene. Patients reflected on the quality of sleep they typically receive and identified areas that need improvement. Group was given background information on sleep and sleep hygiene, including common sleep disorders. Group members also received information on how to improve one's sleep and introduced a sleep diary as a tool that can be utilized to track sleep quality over a length of time. Group session ended with patients identifying one or more strategies they could utilize or implement into their sleep routine in order to improve overall sleep quality.        Therapeutic Goal(s):  Identify one or more strategies to improve overall sleep hygiene  Identify one or more areas of sleep that are negatively impacted (sleep too much, too little, etc)     Participation Level: Minimal   Participation Quality: Independent   Behavior: Appropriate   Speech/Thought Process: Barely audible   Affect/Mood: Appropriate   Insight: Limited   Judgement: Limited      Modes of Intervention: Education  Patient Response to Interventions:  Attentive   Plan: Continue to engage patient in OT groups 2 - 3x/week.  04/08/2023  Ted Mcalpine, OT   Kerrin Champagne, OT

## 2023-04-08 NOTE — Plan of Care (Signed)

## 2023-04-08 NOTE — Group Note (Signed)
Recreation Therapy Group Note   Group Topic:General Recreation  Group Date: 04/08/2023 Start Time: 0957 End Time: 1027 Facilitators: Zarriah Starkel-McCall, LRT,CTRS Location: [address] Dayroom   Goal Area(s) Addresses:  Patient will use appropriate interactions in play with peers.      Group Description: Keep It Going Volleyball.  Patients were placed in circle. Patients hit a beach ball to each other for as long as possible. LRT kept the time for how the ball was in motion. Patients were to keep the ball moving at all times. If the ball came to a stop at any point, LRT would restart the time. LRT also played music to give the activity a beach vibe.   Affect/Mood: N/A   Participation Level: Did not attend    Clinical Observations/Individualized Feedback:     Plan: Continue to engage patient in RT group sessions 2-3x/week.   Natalin Bible-McCall, LRT,CTRS 04/08/2023 12:10 PM

## 2023-04-08 NOTE — Progress Notes (Signed)
   04/08/23 1520  Charting Type  Charting Type Shift assessment  Safety Check Verification  Has the RN verified the 15 minute safety check completion? Yes  Neurological  Neuro (WDL) WDL  HEENT  HEENT (WDL) WDL  Respiratory  Respiratory (WDL) WDL  Cardiac  Cardiac (WDL) WDL  Vascular  Vascular (WDL) WDL  Integumentary  Integumentary (WDL) WDL  Braden Scale (Ages 8 and up)  Sensory Perceptions 4  Moisture 4  Activity 4  Mobility 4  Nutrition 3  Friction and Shear 3  Braden Scale Score 22  Musculoskeletal  Musculoskeletal (WDL) WDL  Gastrointestinal  Gastrointestinal (WDL) WDL  GU Assessment  Genitourinary (WDL) WDL  Neurological  Level of Consciousness Alert

## 2023-04-08 NOTE — Progress Notes (Signed)
[facility] MD Progress Note  04/08/2023 12:51 PM Miguel Porter  MRN:  [id number] Principal Problem: Psychoactive substance-induced psychosis (HCC) Diagnosis: Principal Problem:   Psychoactive substance-induced psychosis (HCC) Active Problems:   Bipolar disorder, manic (HCC)   Subjective:   Miguel Porter is a 57 year old man with a past medical history significant for substance use disorder (opioids), possible Bipolar Disorder, stimulant use disorder (rx amphetamines), substance use disorder (benzodiazepines), past history of trauma who presented to the [facility] ED on 7/22 under IVC taken out by his romantic partner for hallucinations and bizarre behavior. Patient is currently under IVC. He was brought to the [facility] by police on 7/23.   Case was discussed in the multidisciplinary team. MAR was reviewed and patient is compliant with medications.   PRN's in last 24 hours: Date Time  Med  Dose 07/24 2036 hydrOXYzine 25 mg  Psychiatric Team made the following recommendations yesterday:             -- Change and increase to Seroquel XR 100 mg for acute psychosis             -- Benzodiazepine taper per protocol                         - lorazepam 1 mg q6 prn             -- Opiod withdrawal taper per protocol                         - dicyclomine 20 mg q6 prn                         - clonidine 0.1 mg q4 prn                         - loperamide 2-4 mg prn  Today on interview, met patient in his room. He was sleepy but arousable. Pt reports that he slept well overnight and he is doing well overall. Expressed gratitude for the care of the team. When asked how he was feeling about his presentation here, he still has gaps in his memories. Asked him more about his religious practices, and he said that he's "not crazy religious" he just "likes to read the bible." When asked whether he agreed with statements like "have you recently seen angels?" He said: "not since coming here, but in the last few weeks,  yes." Pt also admitted to being diagnosed with bipolar disorder in the last seven years. He was not sure how he felt about that diagnosis.   Sleep: Good Appetite:  Fair Depression: Denies depression Anxiety: Denies Auditory Hallucinations: Denies Visual Hallucinations: Denies currently "but I would be lying to you if I said I hadn't seen angels throughout my life. Just not now." Paranoia: Denies HI: Denies SI: Denies Side effects from medications: does not endorse any side-effects they attribute to medications. Other concerns discussed with patient:  pt shared that his girlfriend is concerned that he was planning to break up with her. Also conducted the SLUMS examination with patient.   Total time spent with patient: 30 minutes  Past psychiatric history: Pt denied any psychiatric history other than rehab visits for substance use disorders.   Past Medical History:  Past Medical History:  Diagnosis Date   Kidney stone    Rheumatoid arthritis (HCC)  Past Surgical History:  Procedure Laterality Date   CHOLECYSTECTOMY  01/01/2012   Procedure: LAPAROSCOPIC CHOLECYSTECTOMY;  Surgeon: Dalia Heading, MD;  Location: [facility];  Service: General;  Laterality: N/A;   LITHOTRIPSY     Family History:  Family History  Problem Relation Age of Onset   Hypertension Father    Heart Problems Father    Family Psychiatric History: denies Social History:  Social History   Substance and Sexual Activity  Alcohol Use Yes   Comment: very little, 6 pack beer/year     Social History   Substance and Sexual Activity  Drug Use Yes   Types: Amphetamines, Benzodiazepines    Social History   Socioeconomic History   Marital status: Legally Separated    Spouse name: Not on file   Number of children: Not on file   Years of education: Not on file   Highest education level: Not on file  Occupational History   Not on file  Tobacco Use   Smoking status: Former    Current packs/day: 0.00     Average packs/day: 1 pack/day for 28.0 years (28.0 ttl pk-yrs)    Types: Cigarettes    Start date: 11/1991    Quit date: 11/2019    Years since quitting: 3.4   Smokeless tobacco: Never  Vaping Use   Vaping status: Every Day   Substances: Nicotine  Substance and Sexual Activity   Alcohol use: Yes    Comment: very little, 6 pack beer/year   Drug use: Yes    Types: Amphetamines, Benzodiazepines   Sexual activity: Not on file  Other Topics Concern   Not on file  Social History Narrative   Not on file   Social Determinants of Health   Financial Resource Strain: Not on file  Food Insecurity: Patient Declined (04/06/2023)   Hunger Vital Sign    Worried About Running Out of Food in the Last Year: Patient declined    Ran Out of Food in the Last Year: Patient declined  Transportation Needs: Patient Declined (04/06/2023)   PRAPARE - Administrator, Civil Service (Medical): Patient declined    Lack of Transportation (Non-Medical): Patient declined  Physical Activity: Not on file  Stress: Not on file  Social Connections: Not on file   Additional Social History:   Collateral obtained from:  Current Medications: Current Facility-Administered Medications  Medication Dose Route Frequency Provider Last Rate Last Admin   acetaminophen (TYLENOL) tablet 650 mg  650 mg Oral Q6H PRN Ardis Hughs, NP       alum & mag hydroxide-simeth (MAALOX/MYLANTA) 200-200-20 MG/5ML suspension 30 mL  30 mL Oral Q4H PRN Ardis Hughs, NP       cloNIDine (CATAPRES) tablet 0.1 mg  0.1 mg Oral Q4H PRN Margaretmary Dys, MD       dicyclomine (BENTYL) tablet 20 mg  20 mg Oral Q6H PRN Margaretmary Dys, MD       diphenhydrAMINE (BENADRYL) capsule 50 mg  50 mg Oral TID PRN Ardis Hughs, NP       Or   diphenhydrAMINE (BENADRYL) injection 50 mg  50 mg Intramuscular TID PRN Ardis Hughs, NP       haloperidol (HALDOL) tablet 5 mg  5 mg Oral TID PRN Ardis Hughs, NP       Or   haloperidol lactate (HALDOL) injection 5 mg  5 mg Intramuscular TID PRN Ardis Hughs, NP  hydrOXYzine (ATARAX) tablet 25 mg  25 mg Oral TID PRN Ardis Hughs, NP   25 mg at 04/07/23 2036   loperamide (IMODIUM) capsule 2-4 mg  2-4 mg Oral PRN Margaretmary Dys, MD       LORazepam (ATIVAN) tablet 2 mg  2 mg Oral TID PRN Ardis Hughs, NP       Or   LORazepam (ATIVAN) injection 2 mg  2 mg Intramuscular TID PRN Ardis Hughs, NP       LORazepam (ATIVAN) tablet 1 mg  1 mg Oral Q6H PRN Margaretmary Dys, MD       magnesium hydroxide (MILK OF MAGNESIA) suspension 30 mL  30 mL Oral Daily PRN Ardis Hughs, NP       methocarbamol (ROBAXIN) tablet 500 mg  500 mg Oral Q8H PRN Margaretmary Dys, MD       multivitamin with minerals tablet 1 tablet  1 tablet Oral Daily Margaretmary Dys, MD   1 tablet at 04/08/23 0825   naproxen (NAPROSYN) tablet 500 mg  500 mg Oral BID PRN Margaretmary Dys, MD       nicotine (NICODERM CQ - dosed in mg/24 hours) patch 14 mg  14 mg Transdermal Q0600 Margaretmary Dys, MD   14 mg at 04/08/23 0825   ondansetron (ZOFRAN-ODT) disintegrating tablet 4 mg  4 mg Oral Q6H PRN Margaretmary Dys, MD       QUEtiapine (SEROQUEL XR) 24 hr tablet 100 mg  100 mg Oral QHS Margaretmary Dys, MD   100 mg at 04/07/23 2036   thiamine (Vitamin B-1) tablet 100 mg  100 mg Oral Daily Margaretmary Dys, MD   100 mg at 04/08/23 0825    Lab Results:  Results for orders placed or performed during the hospital encounter of 04/06/23 (from the past 48 hour(s))  Hemoglobin A1c     Status: Abnormal   Collection Time: 04/08/23  6:37 AM  Result Value Ref Range   Hgb A1c MFr Bld 6.3 (H) 4.8 - 5.6 %    Comment: (NOTE) Pre diabetes:          5.7%-6.4%  Diabetes:              >6.4%  Glycemic control for   <7.0% adults with  diabetes    Mean Plasma Glucose 134.11 mg/dL    Comment: Performed at [facility], [address], [place], [place] [address]  Lipid panel     Status: Abnormal   Collection Time: 04/08/23  6:37 AM  Result Value Ref Range   Cholesterol 166 0 - 200 mg/dL   Triglycerides 40 <604 mg/dL   HDL 53 >54 mg/dL   Total CHOL/HDL Ratio 3.1 RATIO   VLDL 8 0 - 40 mg/dL   LDL Cholesterol 098 (H) 0 - 99 mg/dL    Comment:        Total Cholesterol/HDL:CHD Risk Coronary Heart Disease Risk Table                     Men   Women  1/2 Average Risk   3.4   3.3  Average Risk       5.0   4.4  2 X Average Risk   9.6   7.1  3 X Average Risk  23.4   11.0        Use the calculated Patient Ratio  above and the CHD Risk Table to determine the patient's CHD Risk.        ATP III CLASSIFICATION (LDL):  <100     mg/dL   Optimal  213-086  mg/dL   Near or Above                    Optimal  130-159  mg/dL   Borderline  578-469  mg/dL   High  >629     mg/dL   Very High Performed at [facility], [address], [place], [place] [address]     Blood Alcohol level:  Lab Results  Component Value Date   ETH <10 04/05/2023    Metabolic Disorder Labs: Lab Results  Component Value Date   HGBA1C 6.3 (H) 04/08/2023   MPG 134.11 04/08/2023   No results found for: "PROLACTIN" Lab Results  Component Value Date   CHOL 166 04/08/2023   TRIG 40 04/08/2023   HDL 53 04/08/2023   CHOLHDL 3.1 04/08/2023   VLDL 8 04/08/2023   LDLCALC 105 (H) 04/08/2023    Physical Findings: AIMS: Facial and Oral Movements Muscles of Facial Expression: None, normal Lips and Perioral Area: None, normal Jaw: None, normal Tongue: None, normal,Extremity Movements Upper (arms, wrists, hands, fingers): None, normal Lower (legs, knees, ankles, toes): None, normal, Trunk Movements Neck, shoulders, hips: None, normal, Overall Severity Severity of abnormal movements (highest score from questions above): None,  normal Incapacitation due to abnormal movements: None, normal Patient's awareness of abnormal movements (rate only patient's report): No Awareness, Dental Status Current problems with teeth and/or dentures?: No Does patient usually wear dentures?: No  CIWA:  CIWA-Ar Total: 0 COWS:  COWS Total Score: 0  Musculoskeletal: Strength & Muscle Tone: within normal limits Gait & Station: normal Patient leans: N/A  Psychiatric Specialty Exam:  Presentation  General Appearance:   Casual   Eye Contact:  Fair  Speech:  Slow  Speech Volume:  Decreased  Handedness: No data recorded   Mood and Affect  Mood:  Dysphoric  Affect:  Blunt; Congruent   Thought Process  Thought Processes:  Linear; Coherent  Descriptions of Associations: Loose  Orientation: Full (Time, Place and Person)  Thought Content: Perseveration; Scattered  History of Schizophrenia/Schizoaffective disorder: No  Duration of Psychotic Symptoms: No data recorded Hallucinations: Hallucinations: Visual Description of Visual Hallucinations: Seeing angels prior to admission.  Ideas of Reference: Delusions (Patient believes that he is God per report, Per patient he has been reading the bible a lot and becoming a better person and that bothers my girlfriend)  Suicidal Thoughts: Suicidal Thoughts: No  Homicidal Thoughts: Homicidal Thoughts: No    Sensorium  Memory: Immediate Poor; Recent Fair; Remote Fair   Judgment:  Fair   Insight:  Shallow    Executive Functions  Concentration:  Fair  Attention Span:  Good  Recall:  Good  Fund of Knowledge:  Fair  Language:  Good  Psychomotor Activity  Psychomotor Activity: Psychomotor Activity: Normal   Physical Exam: Physical Exam Vitals and nursing note reviewed.  Constitutional:      General: He is not in acute distress.    Appearance: Normal appearance. He is not ill-appearing, toxic-appearing or diaphoretic.  HENT:     Head:  Normocephalic and atraumatic.  Pulmonary:     Effort: Pulmonary effort is normal.  Musculoskeletal:        General: Deformity present.  Skin:    General: Skin is warm and dry.  Neurological:  Mental Status: He is oriented to person, place, and time.  Psychiatric:        Mood and Affect: Mood normal.        Behavior: Behavior normal.     Review of Systems  Constitutional:  Negative for chills, fever, malaise/fatigue and weight loss.  Respiratory:  Negative for cough.   Cardiovascular:  Negative for chest pain.  Neurological:  Negative for tremors.  Psychiatric/Behavioral:  Positive for memory loss and substance abuse. Negative for depression and suicidal ideas. The patient is not nervous/anxious and does not have insomnia.     Blood pressure 132/79, pulse 86, temperature 98.1 F (36.7 C), temperature source Oral, resp. rate 16, height 6' (1.829 m), weight 89.8 kg, SpO2 99%. Body mass index is 26.85 kg/m.  ASSESSMENT:  Miguel Porter is a 57 year old man with a past medical history significant for substance use disorder (opioids), possible Bipolar Disorder, stimulant use disorder (rx amphetamines), substance use disorder (benzodiazepines), past history of trauma who presented to the [facility] ED on 7/22 under IVC taken out by his romantic partner for hallucinations and bizarre behavior. Patient is currently under IVC. He was brought to the [facility] by police on 7/23.    7/25: Miguel Porter is more clear and coherent on exam today. He has had a good night of sleep and has greater recall over distant events. His recent recall is still either lacking or he is being avoidant. He was able to estimate his daily adderall usage at 20-25 mg and his xanax consumption at 10 mg, which is roughly 4 times what he reported yesterday. Substance-induced psychosis is still the presumptive diagnosis, but given the patient's recollection of a past bipolar diagnosis, that is still on the differential.  Continue to monitor him for withdrawal symptoms and then transition him to a long-acting antipsychotic. Will need outpatient referrals for substance use disorder.  Conducted the SLUMS exam, and patient scored 23/30, indicating mild cognitive impairment. This can serve as a baseline going forward for him.    Diagnoses / Active Problems: -- Substance-induced psychotic disorder -- Bipolar disorder?   PLAN: Safety and Monitoring:             -- involuntary admission to inpatient psychiatric unit for safety, stabilization and treatment             -- Daily contact with patient to assess and evaluate symptoms and progress in treatment             -- Patient's case to be discussed in multi-disciplinary team meeting             -- Observation Level : q15 minute checks             -- Vital signs:  q12 hours             -- Precautions: suicide, elopement, and assault   2. Psychiatric Diagnoses and Treatment:              -- Change and increase to Seroquel XR 100 mg for acute psychosis             -- Benzodiazepine taper per protocol                         - lorazepam 1 mg q6 prn             -- Opiod withdrawal taper per protocol                         -  dicyclomine 20 mg q6 prn                         - clonidine 0.1 mg q4 prn                         - loperamide 2-4 mg prn  The risks/benefits/side-effects/alternatives to this medication were discussed in detail with the patient and time was given for questions. The patient consents to medication trial.              -- Metabolic profile and EKG monitoring obtained while on an atypical antipsychotic (BMI: Lipid Panel: HbgA1c: QTc: 432 7/22)              -- Encouraged patient to participate in unit milieu and in scheduled group therapies              -- Short Term Goals: Ability to identify changes in lifestyle to reduce recurrence of condition will improve and Ability to identify triggers associated with substance abuse/mental health issues will  improve             -- Long Term Goals: Improvement in symptoms so as ready for discharge                3. Medical Issues Being Addressed:              Labs review, notable for hyponatremia on admission to ED, hypocalcemia, amphetamines, and benzodiazepines, otherwise unremarkable               Tobacco Use Disorder             -- Nicotine patch 21mg /24 hours ordered             -- Smoking cessation encouraged   4. Discharge Planning:              -- Social work and case management to assist with discharge planning and identification of hospital follow-up needs prior to discharge             -- Estimated LOS: 5-7 days             -- Discharge Concerns: Need to establish a safety plan; Medication compliance and effectiveness             -- Discharge Goals: Return home with outpatient referrals for mental health follow-up including medication management/psychotherapy    Margaretmary Dys, MD 04/08/2023, 12:51 PM

## 2023-04-09 DIAGNOSIS — F19959 Other psychoactive substance use, unspecified with psychoactive substance-induced psychotic disorder, unspecified: Secondary | ICD-10-CM

## 2023-04-09 LAB — BASIC METABOLIC PANEL
Anion gap: 9 (ref 5–15)
BUN: 16 mg/dL (ref 6–20)
CO2: 26 mmol/L (ref 22–32)
Calcium: 9 mg/dL (ref 8.9–10.3)
Chloride: 106 mmol/L (ref 98–111)
Creatinine, Ser: 0.81 mg/dL (ref 0.61–1.24)
GFR, Estimated: >60 mL/min (ref >60)
Glucose, Bld: 95 mg/dL (ref 70–99)
Potassium: 3.5 mmol/L (ref 3.5–5.1)
Sodium: 141 mmol/L (ref 135–145)

## 2023-04-09 NOTE — Progress Notes (Signed)
   04/09/23 0100  Psych Admission Type (Psych Patients Only)  Admission Status Involuntary  Psychosocial Assessment  Patient Complaints None  Eye Contact Brief  Facial Expression Flat  Affect Appropriate to circumstance  Speech Logical/coherent  Interaction Minimal;Isolative  Motor Activity Slow  Appearance/Hygiene In scrubs  Behavior Characteristics Cooperative;Appropriate to situation  Thought Process  Coherency WDL  Content WDL  Delusions None reported or observed  Perception WDL  Hallucination None reported or observed  Judgment Poor  Confusion None  Danger to Self  Current suicidal ideation? Denies  Danger to Others  Danger to Others None reported or observed

## 2023-04-09 NOTE — Progress Notes (Addendum)
[facility] MD Progress Note  04/10/2023 7:15 AM Miguel Porter  MRN:  [id number] Principal Problem: Psychoactive substance-induced psychosis (HCC) Diagnosis: Principal Problem:   Psychoactive substance-induced psychosis (HCC) Active Problems:   Bipolar disorder, manic (HCC)   Subjective:   Skiler Reitano is a 57 year old man with a past medical history significant for substance use disorder (opioids), possible Bipolar Disorder, stimulant use disorder (rx amphetamines), substance use disorder (benzodiazepines), past history of trauma who presented to the [facility] ED on 7/22 under IVC taken out by his romantic partner for hallucinations and bizarre behavior. Patient is currently under IVC. He was brought to the [facility] by police on 7/23.   Case was discussed in the multidisciplinary team. MAR was reviewed and patient is compliant with medications.   PRN's in last 24 hours: Date Time  Med  Dose 07/24 2036 hydrOXYzine 25 mg  Psychiatric Team made the following recommendations yesterday:             -- Change and increase to Seroquel XR 100 mg for acute psychosis             -- Benzodiazepine taper per protocol                         - lorazepam 1 mg q6 prn             -- Opiod withdrawal taper per protocol                         - dicyclomine 20 mg q6 prn                         - clonidine 0.1 mg q4 prn                         - loperamide 2-4 mg prn  Today on interview, met patient in his room. He was sleepy but arousable. Pt reported a poor night of sleep and an interest in leaving soon. Broached the topic of medication with him and he was amenable to the idea of changing medications to a long-acting injectable formulation of an antipsychotic for his maintenance medication. He thought that would help him with his symptoms.   Sleep: Good Appetite:  Good Depression: Denies depression Anxiety: Denies Auditory Hallucinations: Denies Visual Hallucinations: Denies currently. Paranoia:  Denies HI: Denies SI: Denies Side effects from medications: does not endorse any side-effects they attribute to medications. Other concerns discussed with patient: Discussed pt's love of basketball, especially [employer] basketball and his enjoyment of video games. Patient is on partial disability.  Total time spent with patient: 20 minutes  Past psychiatric history: Pt acknowledged he had been diagnosed with bipolar disorder 5-6-7 years ago.   Past Medical History:  Past Medical History:  Diagnosis Date   Kidney stone    Rheumatoid arthritis (HCC)     Past Surgical History:  Procedure Laterality Date   CHOLECYSTECTOMY  01/01/2012   Procedure: LAPAROSCOPIC CHOLECYSTECTOMY;  Surgeon: Dalia Heading, MD;  Location: [facility];  Service: General;  Laterality: N/A;   LITHOTRIPSY     Family History:  Family History  Problem Relation Age of Onset   Hypertension Father    Heart Problems Father    Family Psychiatric History: denies Social History:  Social History   Substance and Sexual Activity  Alcohol Use Yes  Comment: very little, 6 pack beer/year     Social History   Substance and Sexual Activity  Drug Use Yes   Types: Amphetamines, Benzodiazepines    Social History   Socioeconomic History   Marital status: Legally Separated    Spouse name: Not on file   Number of children: Not on file   Years of education: Not on file   Highest education level: Not on file  Occupational History   Not on file  Tobacco Use   Smoking status: Former    Current packs/day: 0.00    Average packs/day: 1 pack/day for 28.0 years (28.0 ttl pk-yrs)    Types: Cigarettes    Start date: 11/1991    Quit date: 11/2019    Years since quitting: 3.4   Smokeless tobacco: Never  Vaping Use   Vaping status: Every Day   Substances: Nicotine  Substance and Sexual Activity   Alcohol use: Yes    Comment: very little, 6 pack beer/year   Drug use: Yes    Types: Amphetamines, Benzodiazepines   Sexual  activity: Not on file  Other Topics Concern   Not on file  Social History Narrative   Not on file   Social Determinants of Health   Financial Resource Strain: Not on file  Food Insecurity: Patient Declined (04/06/2023)   Hunger Vital Sign    Worried About Running Out of Food in the Last Year: Patient declined    Ran Out of Food in the Last Year: Patient declined  Transportation Needs: Patient Declined (04/06/2023)   PRAPARE - Administrator, Civil Service (Medical): Patient declined    Lack of Transportation (Non-Medical): Patient declined  Physical Activity: Not on file  Stress: Not on file  Social Connections: Not on file   Additional Social History:   Collateral obtained from:  Current Medications: Current Facility-Administered Medications  Medication Dose Route Frequency Provider Last Rate Last Admin   acetaminophen (TYLENOL) tablet 650 mg  650 mg Oral Q6H PRN Ardis Hughs, NP       alum & mag hydroxide-simeth (MAALOX/MYLANTA) 200-200-20 MG/5ML suspension 30 mL  30 mL Oral Q4H PRN Ardis Hughs, NP       cloNIDine (CATAPRES) tablet 0.1 mg  0.1 mg Oral Q4H PRN Margaretmary Dys, MD   0.1 mg at 04/10/23 4259   dicyclomine (BENTYL) tablet 20 mg  20 mg Oral Q6H PRN Margaretmary Dys, MD       diphenhydrAMINE (BENADRYL) capsule 50 mg  50 mg Oral TID PRN Ardis Hughs, NP       Or   diphenhydrAMINE (BENADRYL) injection 50 mg  50 mg Intramuscular TID PRN Ardis Hughs, NP       haloperidol (HALDOL) tablet 5 mg  5 mg Oral TID PRN Ardis Hughs, NP       Or   haloperidol lactate (HALDOL) injection 5 mg  5 mg Intramuscular TID PRN Ardis Hughs, NP       hydrOXYzine (ATARAX) tablet 25 mg  25 mg Oral TID PRN Ardis Hughs, NP   25 mg at 04/10/23 5638   loperamide (IMODIUM) capsule 2-4 mg  2-4 mg Oral PRN Margaretmary Dys, MD       LORazepam (ATIVAN) tablet 2 mg  2 mg Oral TID PRN Ardis Hughs, NP       Or   LORazepam (ATIVAN) injection 2 mg  2 mg Intramuscular TID PRN  Ardis Hughs, NP       LORazepam (ATIVAN) tablet 1 mg  1 mg Oral Q6H PRN Margaretmary Dys, MD   1 mg at 04/10/23 2956   magnesium hydroxide (MILK OF MAGNESIA) suspension 30 mL  30 mL Oral Daily PRN Ardis Hughs, NP       methocarbamol (ROBAXIN) tablet 500 mg  500 mg Oral Q8H PRN Margaretmary Dys, MD   500 mg at 04/09/23 2116   multivitamin with minerals tablet 1 tablet  1 tablet Oral Daily Margaretmary Dys, MD   1 tablet at 04/09/23 0745   naproxen (NAPROSYN) tablet 500 mg  500 mg Oral BID PRN Margaretmary Dys, MD       nicotine (NICODERM CQ - dosed in mg/24 hours) patch 14 mg  14 mg Transdermal Q0600 Margaretmary Dys, MD   14 mg at 04/10/23 2130   ondansetron (ZOFRAN-ODT) disintegrating tablet 4 mg  4 mg Oral Q6H PRN Margaretmary Dys, MD       QUEtiapine (SEROQUEL XR) 24 hr tablet 100 mg  100 mg Oral QHS Margaretmary Dys, MD   100 mg at 04/09/23 2116   thiamine (Vitamin B-1) tablet 100 mg  100 mg Oral Daily Margaretmary Dys, MD   100 mg at 04/09/23 8657    Lab Results:  Results for orders placed or performed during the hospital encounter of 04/06/23 (from the past 48 hour(s))  Basic metabolic panel     Status: None   Collection Time: 04/09/23  6:10 AM  Result Value Ref Range   Sodium 141 135 - 145 mmol/L   Potassium 3.5 3.5 - 5.1 mmol/L   Chloride 106 98 - 111 mmol/L   CO2 26 22 - 32 mmol/L   Glucose, Bld 95 70 - 99 mg/dL    Comment: Glucose reference range applies only to samples taken after fasting for at least 8 hours.   BUN 16 6 - 20 mg/dL   Creatinine, Ser 8.46 0.61 - 1.24 mg/dL   Calcium 9.0 8.9 - 96.2 mg/dL   GFR, Estimated >95 >28 mL/min    Comment: (NOTE) Calculated using the CKD-EPI Creatinine Equation (2021)    Anion gap 9 5 - 15    Comment: Performed at [facility], [address], [place], [place] [address]    Blood Alcohol level:  Lab Results  Component Value Date   ETH <10 04/05/2023    Metabolic Disorder Labs: Lab Results  Component Value Date   HGBA1C 6.3 (H) 04/08/2023   MPG 134.11 04/08/2023   No results found for: "PROLACTIN" Lab Results  Component Value Date   CHOL 166 04/08/2023   TRIG 40 04/08/2023   HDL 53 04/08/2023   CHOLHDL 3.1 04/08/2023   VLDL 8 04/08/2023   LDLCALC 105 (H) 04/08/2023    Physical Findings: AIMS: Facial and Oral Movements Muscles of Facial Expression: None, normal Lips and Perioral Area: None, normal Jaw: None, normal Tongue: None, normal,Extremity Movements Upper (arms, wrists, hands, fingers): None, normal Lower (legs, knees, ankles, toes): None, normal, Trunk Movements Neck, shoulders, hips: None, normal, Overall Severity Severity of abnormal movements (highest score from questions above): None, normal Incapacitation due to abnormal movements: None, normal Patient's awareness of abnormal movements (rate only patient's report): No Awareness, Dental Status Current problems with teeth and/or dentures?: No Does patient usually wear dentures?: No  CIWA:  CIWA-Ar Total: 14 COWS:  COWS Total Score: 3  Musculoskeletal: Strength & Muscle Tone: within normal limits Gait & Station: normal Patient leans: N/A  Psychiatric Specialty Exam:  Presentation  General Appearance:   Appropriate for Environment   Eye Contact:  Good  Speech:  Clear and Coherent; Normal Rate  Speech Volume:  Decreased  Handedness: Right    Mood and Affect  Mood:  Depressed  Affect:  Congruent; Appropriate; Restricted   Thought Process  Thought Processes:  Coherent; Goal Directed; Linear  Descriptions of Associations: Intact  Orientation: Full (Time, Place and Person)  Thought Content: Abstract Reasoning; Logical  History of Schizophrenia/Schizoaffective disorder:  No  Duration of Psychotic Symptoms: No data recorded Hallucinations: Hallucinations: None  Ideas of Reference: None  Suicidal Thoughts: Suicidal Thoughts: No  Homicidal Thoughts: Homicidal Thoughts: No    Sensorium  Memory: Remote Good; Immediate Fair; Recent Fair   Judgment:  Fair   Insight:  Fair    Art therapist  Concentration:  Good  Attention Span:  Good  Recall:  Good  Fund of Knowledge:  Good  Language:  Good  Psychomotor Activity  Psychomotor Activity: Psychomotor Activity: Normal   Physical Exam: Physical Exam Vitals and nursing note reviewed.  Constitutional:      General: He is not in acute distress.    Appearance: Normal appearance. He is not ill-appearing, toxic-appearing or diaphoretic.  HENT:     Head: Normocephalic and atraumatic.  Pulmonary:     Effort: Pulmonary effort is normal.  Musculoskeletal:        General: Deformity present.  Skin:    General: Skin is warm and dry.  Neurological:     Mental Status: He is oriented to person, place, and time.  Psychiatric:        Mood and Affect: Mood normal.        Behavior: Behavior normal.     Review of Systems  Constitutional:  Negative for chills, fever, malaise/fatigue and weight loss.  Respiratory:  Negative for cough.   Cardiovascular:  Negative for chest pain.  Neurological:  Negative for tremors.  Psychiatric/Behavioral:  Positive for memory loss and substance abuse. Negative for depression and suicidal ideas. The patient is not nervous/anxious and does not have insomnia.     Blood pressure 121/87, pulse (!) 110, temperature (!) 97.4 F (36.3 C), temperature source Oral, resp. rate 16, height 6' (1.829 m), weight 89.8 kg, SpO2 97%. Body mass index is 26.85 kg/m.  ASSESSMENT:  Miguel Porter is a 57 year old man with a past medical history significant for substance use disorder (opioids), possible Bipolar Disorder, stimulant use disorder (rx amphetamines), substance  use disorder (benzodiazepines), past history of trauma who presented to the [facility] ED on 7/22 under IVC taken out by his romantic partner for hallucinations and bizarre behavior. Patient is currently under IVC. He was brought to the [facility] by police on 7/23.    7/26: Mr Bors is improved relative to yesterday. He has tolerated his nighttime dosing of seroquel well and has no reported side effects. He would be appropriate for transitioning towards discharge either Sunday or Monday. Discussed his substance use at greater length today and reiterated that it was probably directly linked to his psychosis and presentation. He understood and was able to meaningfully talk through his concerns for recurrent substance use (especially the xanax, he believed that would be harder for him to stop). At this time, it is appropriate to not only move him down out of the more acute  unit, but also to look towards his outpatient maintenance and his discharge.     Diagnoses / Active Problems: -- Substance-induced psychotic disorder -- Bipolar disorder   PLAN: Safety and Monitoring:             -- involuntary admission to inpatient psychiatric unit for safety, stabilization and treatment             -- Daily contact with patient to assess and evaluate symptoms and progress in treatment             -- Patient's case to be discussed in multi-disciplinary team meeting             -- Observation Level : q15 minute checks             -- Vital signs:  q12 hours             -- Precautions: suicide, elopement, and assault   2. Psychiatric Diagnoses and Treatment:              -- Change and increase to Seroquel XR 100 mg for acute psychosis             -- Benzodiazepine taper per protocol                         - lorazepam 1 mg q6 prn   The risks/benefits/side-effects/alternatives to this medication were discussed in detail with the patient and time was given for questions. The patient consents to medication  trial.              -- Metabolic profile and EKG monitoring obtained while on an atypical antipsychotic (BMI: Lipid Panel: HbgA1c: QTc: 432 7/22)              -- Encouraged patient to participate in unit milieu and in scheduled group therapies              -- Short Term Goals: Ability to identify changes in lifestyle to reduce recurrence of condition will improve and Ability to identify triggers associated with substance abuse/mental health issues will improve             -- Long Term Goals: Improvement in symptoms so as ready for discharge                3. Medical Issues Being Addressed:              Labs review, notable for hyponatremia on admission to ED, hypocalcemia, amphetamines, and benzodiazepines, otherwise unremarkable               Tobacco Use Disorder             -- Nicotine patch 21mg /24 hours ordered             -- Smoking cessation encouraged   4. Discharge Planning:              -- Social work and case management to assist with discharge planning and identification of hospital follow-up needs prior to discharge             -- Estimated LOS: 5-7 days             -- Discharge Concerns: Need to establish a safety plan; Medication compliance and effectiveness             -- Discharge Goals: Return home with outpatient referrals for mental health follow-up including medication management/psychotherapy   Addendum  to fix cosigner. Margaretmary Dys, MD 04/10/2023, 7:15 AM

## 2023-04-09 NOTE — Plan of Care (Signed)

## 2023-04-09 NOTE — Plan of Care (Signed)
  Problem: Activity: Goal: Interest or engagement in activities will improve Outcome: Progressing Goal: Sleeping patterns will improve Outcome: Progressing   Problem: Coping: Goal: Ability to verbalize frustrations and anger appropriately will improve Outcome: Progressing Goal: Ability to demonstrate self-control will improve Outcome: Progressing  Patient compliant with medications denies SI/HI/A/VH endorses minimal anxiety prn vistaril given at HS and effective. Patient is pleasant looking forward to discharge on Sunday. Support and encouragement provided.

## 2023-04-09 NOTE — Group Note (Signed)
Recreation Therapy Group Note   Group Topic:Team Building  Group Date: 04/09/2023 Start Time: 1006 End Time: 1026 Facilitators: Anastasio Wogan-McCall, LRT,CTRS Location: [address] Dayroom   Goal Area(s) Addresses:  Patient will effectively work with peer towards shared goal.  Patient will identify skills used to make activity successful.  Patient will identify how skills used during activity can be applied to reach post d/c goals.   Group Description: [employer]. In teams of 5-6, patients were given 11 craft pipe cleaners. Using the materials provided, patients were instructed to compete again the opposing team(s) to build the tallest free-standing structure from floor level. The activity was timed; difficulty increased by Clinical research associate as Production designer, theatre/television/film continued.  Systematically resources were removed with additional directions for example, placing one arm behind their back, working in silence, and shape stipulations. LRT facilitated post-activity discussion reviewing team processes and necessary communication skills involved in completion. Patients were encouraged to reflect how the skills utilized, or not utilized, in this activity can be incorporated to positively impact support systems post discharge.   Affect/Mood: Appropriate   Participation Level: Engaged   Participation Quality: Independent   Behavior: Appropriate   Speech/Thought Process: Focused   Insight: Moderate   Judgement: Moderate   Modes of Intervention: STEM Activity   Patient Response to Interventions:  Engaged   Education Outcome:  Acknowledges education   Clinical Observations/Individualized Feedback: Pt was engaged and worked well with peers. Pt expressed during processing when steps in a plan don't work, you have to "go back to the drawing board".      Plan: Continue to engage patient in RT group sessions 2-3x/week.   Merve Hotard-McCall, LRT,CTRS  04/09/2023 12:04 PM

## 2023-04-09 NOTE — Progress Notes (Signed)
   04/08/23 2300  Psych Admission Type (Psych Patients Only)  Admission Status Involuntary  Psychosocial Assessment  Patient Complaints None  Eye Contact Brief  Facial Expression Flat  Affect Appropriate to circumstance  Speech Logical/coherent  Interaction Minimal;Isolative  Motor Activity Slow  Appearance/Hygiene In scrubs  Behavior Characteristics Appropriate to situation  Mood Depressed  Thought Process  Coherency WDL  Content WDL  Delusions None reported or observed  Perception WDL  Hallucination None reported or observed  Judgment Poor  Confusion None  Danger to Self  Current suicidal ideation? Denies  Danger to Others  Danger to Others None reported or observed

## 2023-04-09 NOTE — Progress Notes (Signed)
Adult Psychoeducational Group Note  Date:  04/09/2023 Time:  11:09 AM  Group Topic/Focus:  Goals Group:   The focus of this group is to help patients establish daily goals to achieve during treatment and discuss how the patient can incorporate goal setting into their daily lives to aide in recovery.  Participation Level:  Active  Participation Quality:  Appropriate  Affect:  Appropriate  Cognitive:  Appropriate  Insight:   Modes of Intervention:  Discussion  Additional Comments: The patient  attended group  Miguel Porter 04/09/2023, 11:09 AM

## 2023-04-09 NOTE — [facility] Suicide Risk Assessment (Signed)
[facility] INPATIENT:  Family/Significant Other Suicide Prevention Education  Suicide Prevention Education:  Education Completed; Girlfriend Crystal [phone], has been identified by the patient as the family member/significant other with whom the patient will be residing, and identified as the person(s) who will aid the patient in the event of a mental health crisis (suicidal ideations/suicide attempt).  With written consent from the patient, the family member/significant other has been provided the following suicide prevention education, prior to the and/or following the discharge of the patient.  The suicide prevention education provided includes the following: Suicide risk factors Suicide prevention and interventions National Suicide Hotline telephone number [facility] assessment telephone number [facility] Emergency Assistance 911 [facility] and/or Residential Mobile Crisis Unit telephone number  Request made of family/significant other to: Remove weapons (e.g., guns, rifles, knives), all items previously/currently identified as safety concern.   Remove drugs/medications (over-the-counter, prescriptions, illicit drugs), all items previously/currently identified as a safety concern.  The family member/significant other verbalizes understanding of the suicide prevention education information provided.  The family member/significant other agrees to remove the items of safety concern listed above.  Miguel Porter 04/09/2023, 2:45 PM

## 2023-04-10 DIAGNOSIS — F1310 Sedative, hypnotic or anxiolytic abuse, uncomplicated: Secondary | ICD-10-CM | POA: Diagnosis present

## 2023-04-10 DIAGNOSIS — R7303 Prediabetes: Secondary | ICD-10-CM | POA: Diagnosis present

## 2023-04-10 MED ORDER — TRAZODONE HCL 50 MG PO TABS
50.0000 mg | ORAL_TABLET | Freq: Once | ORAL | Status: AC
  Administered 2023-04-10: 50 mg via ORAL
  Filled 2023-04-10 (×2): qty 1

## 2023-04-10 MED ORDER — MELATONIN 3 MG PO TABS
3.0000 mg | ORAL_TABLET | Freq: Every day | ORAL | Status: DC
  Administered 2023-04-11: 3 mg via ORAL
  Filled 2023-04-10 (×4): qty 1

## 2023-04-10 MED ORDER — QUETIAPINE FUMARATE ER 50 MG PO TB24
150.0000 mg | ORAL_TABLET | Freq: Every day | ORAL | Status: DC
  Administered 2023-04-10: 150 mg via ORAL
  Filled 2023-04-10 (×4): qty 3

## 2023-04-10 MED ORDER — METFORMIN HCL ER 500 MG PO TB24
500.0000 mg | ORAL_TABLET | Freq: Every day | ORAL | Status: DC
  Administered 2023-04-11: 500 mg via ORAL
  Filled 2023-04-10 (×4): qty 1

## 2023-04-10 NOTE — Assessment & Plan Note (Signed)
HgbA1c 6.3%, 04/09/2023. Started on metformin 500 mg QAM

## 2023-04-10 NOTE — Progress Notes (Signed)
[facility] MD Progress Note  04/10/2023 1:35 PM WATKINS LACHAT  MRN:  [id number] Principal Problem: Psychoactive substance-induced psychosis (HCC) Diagnosis: Principal Problem:   Psychoactive substance-induced psychosis (HCC) Active Problems:   Bipolar disorder, manic (HCC)   Subjective:   Miguel Porter is a 57 year old man with a past medical history significant for substance use disorder (opioids), Bipolar Disorder, stimulant use disorder (rx amphetamines), substance use disorder (benzodiazepines), past history of trauma who presented to the [facility] ED on 7/22 under IVC taken out by his romantic partner for hallucinations and bizarre behavior. Patient is currently under IVC. He was brought to the [facility] by police on 7/23.   Case was discussed in the multidisciplinary team. MAR was reviewed and patient is compliant with medications.   PRN's in last 24 hours: Date Time  Med  Dose 07/24 2036 hydrOXYzine 25 mg  Psychiatric Team made the following recommendations yesterday:             -- Change and increase to Seroquel XR 100 mg for acute psychosis             -- Benzodiazepine taper per protocol                         - lorazepam 1 mg q6 prn             -- Opiod withdrawal taper per protocol                         - dicyclomine 20 mg q6 prn                         - clonidine 0.1 mg q4 prn                         - loperamide 2-4 mg prn  Today on interview, met patient in his room. He was sleepy but arousable. Pt reported a poor night of sleep and an interest in leaving soon. Broached the topic of medication with him and he was amenable to the idea of changing medications to a long-acting injectable formulation of an antipsychotic for his maintenance medication. He thought that would help him with his symptoms.   Sleep: Good Appetite:  Good Depression: Denies depression Anxiety: Denies Auditory Hallucinations: Denies Visual Hallucinations: Denies currently. Paranoia: Denies HI:  Denies SI: Denies Side effects from medications: does not endorse any side-effects they attribute to medications. Other concerns discussed with patient: Discussed pt's love of basketball, especially [employer] basketball and his enjoyment of video games. Patient is on partial disability.  Total time spent with patient: 20 minutes  Past psychiatric history: Pt acknowledged he had been diagnosed with bipolar disorder 5-6-7 years ago.   Past Medical History:  Past Medical History:  Diagnosis Date   Kidney stone    Rheumatoid arthritis (HCC)     Past Surgical History:  Procedure Laterality Date   CHOLECYSTECTOMY  01/01/2012   Procedure: LAPAROSCOPIC CHOLECYSTECTOMY;  Surgeon: Dalia Heading, MD;  Location: [facility];  Service: General;  Laterality: N/A;   LITHOTRIPSY     Family History:  Family History  Problem Relation Age of Onset   Hypertension Father    Heart Problems Father    Family Psychiatric History: denies Social History:  Social History   Substance and Sexual Activity  Alcohol Use Yes  Comment: very little, 6 pack beer/year     Social History   Substance and Sexual Activity  Drug Use Yes   Types: Amphetamines, Benzodiazepines    Social History   Socioeconomic History   Marital status: Legally Separated    Spouse name: Not on file   Number of children: Not on file   Years of education: Not on file   Highest education level: Not on file  Occupational History   Not on file  Tobacco Use   Smoking status: Former    Current packs/day: 0.00    Average packs/day: 1 pack/day for 28.0 years (28.0 ttl pk-yrs)    Types: Cigarettes    Start date: 11/1991    Quit date: 11/2019    Years since quitting: 3.4   Smokeless tobacco: Never  Vaping Use   Vaping status: Every Day   Substances: Nicotine  Substance and Sexual Activity   Alcohol use: Yes    Comment: very little, 6 pack beer/year   Drug use: Yes    Types: Amphetamines, Benzodiazepines   Sexual activity: Not on  file  Other Topics Concern   Not on file  Social History Narrative   Not on file   Social Determinants of Health   Financial Resource Strain: Not on file  Food Insecurity: Patient Declined (04/06/2023)   Hunger Vital Sign    Worried About Running Out of Food in the Last Year: Patient declined    Ran Out of Food in the Last Year: Patient declined  Transportation Needs: Patient Declined (04/06/2023)   PRAPARE - Administrator, Civil Service (Medical): Patient declined    Lack of Transportation (Non-Medical): Patient declined  Physical Activity: Not on file  Stress: Not on file  Social Connections: Not on file   Additional Social History:   Collateral obtained from: girlfriend [name]: [phone]. Spoke with her for approximately ten minutes. Shared with her that Miguel Porter is doing much better and would be appropriate to discharge tomorrow. We have changed around the timing of some of his medications to help him with both his sleep and his bipolar disorder. She stated that she could be here to pick him up 7/28 at 1 pm.  Current Medications: Current Facility-Administered Medications  Medication Dose Route Frequency Provider Last Rate Last Admin   acetaminophen (TYLENOL) tablet 650 mg  650 mg Oral Q6H PRN Ardis Hughs, NP       alum & mag hydroxide-simeth (MAALOX/MYLANTA) 200-200-20 MG/5ML suspension 30 mL  30 mL Oral Q4H PRN Ardis Hughs, NP       cloNIDine (CATAPRES) tablet 0.1 mg  0.1 mg Oral Q4H PRN Margaretmary Dys, MD   0.1 mg at 04/10/23 5284   dicyclomine (BENTYL) tablet 20 mg  20 mg Oral Q6H PRN Margaretmary Dys, MD       diphenhydrAMINE (BENADRYL) capsule 50 mg  50 mg Oral TID PRN Ardis Hughs, NP       Or   diphenhydrAMINE (BENADRYL) injection 50 mg  50 mg Intramuscular TID PRN Ardis Hughs, NP       haloperidol (HALDOL) tablet 5 mg  5 mg Oral TID PRN Ardis Hughs, NP       Or   haloperidol  lactate (HALDOL) injection 5 mg  5 mg Intramuscular TID PRN Ardis Hughs, NP       hydrOXYzine (ATARAX) tablet 25 mg  25 mg Oral TID PRN Ardis Hughs, NP  25 mg at 04/10/23 1610   loperamide (IMODIUM) capsule 2-4 mg  2-4 mg Oral PRN Margaretmary Dys, MD       LORazepam (ATIVAN) tablet 2 mg  2 mg Oral TID PRN Ardis Hughs, NP       Or   LORazepam (ATIVAN) injection 2 mg  2 mg Intramuscular TID PRN Ardis Hughs, NP       magnesium hydroxide (MILK OF MAGNESIA) suspension 30 mL  30 mL Oral Daily PRN Ardis Hughs, NP       melatonin tablet 3 mg  3 mg Oral QHS Margaretmary Dys, MD       methocarbamol (ROBAXIN) tablet 500 mg  500 mg Oral Q8H PRN Margaretmary Dys, MD   500 mg at 04/09/23 2116   multivitamin with minerals tablet 1 tablet  1 tablet Oral Daily Margaretmary Dys, MD   1 tablet at 04/10/23 1300   naproxen (NAPROSYN) tablet 500 mg  500 mg Oral BID PRN Margaretmary Dys, MD       nicotine (NICODERM CQ - dosed in mg/24 hours) patch 14 mg  14 mg Transdermal Q0600 Margaretmary Dys, MD   14 mg at 04/10/23 0639   ondansetron (ZOFRAN-ODT) disintegrating tablet 4 mg  4 mg Oral Q6H PRN Margaretmary Dys, MD       QUEtiapine (SEROQUEL XR) 24 hr tablet 150 mg  150 mg Oral QPC supper Margaretmary Dys, MD       thiamine (Vitamin B-1) tablet 100 mg  100 mg Oral Daily Margaretmary Dys, MD   100 mg at 04/10/23 1300    Lab Results:  Results for orders placed or performed during the hospital encounter of 04/06/23 (from the past 48 hour(s))  Basic metabolic panel     Status: None   Collection Time: 04/09/23  6:10 AM  Result Value Ref Range   Sodium 141 135 - 145 mmol/L   Potassium 3.5 3.5 - 5.1 mmol/L   Chloride 106 98 - 111 mmol/L   CO2 26 22 - 32 mmol/L   Glucose, Bld 95 70 - 99 mg/dL    Comment: Glucose reference range applies only to samples  taken after fasting for at least 8 hours.   BUN 16 6 - 20 mg/dL   Creatinine, Ser 9.60 0.61 - 1.24 mg/dL   Calcium 9.0 8.9 - 45.4 mg/dL   GFR, Estimated >09 >81 mL/min    Comment: (NOTE) Calculated using the CKD-EPI Creatinine Equation (2021)    Anion gap 9 5 - 15    Comment: Performed at [facility], [address], [place], [place] [address]    Blood Alcohol level:  Lab Results  Component Value Date   ETH <10 04/05/2023    Metabolic Disorder Labs: Lab Results  Component Value Date   HGBA1C 6.3 (H) 04/08/2023   MPG 134.11 04/08/2023   No results found for: "PROLACTIN" Lab Results  Component Value Date   CHOL 166 04/08/2023   TRIG 40 04/08/2023   HDL 53 04/08/2023   CHOLHDL 3.1 04/08/2023   VLDL 8 04/08/2023   LDLCALC 105 (H) 04/08/2023    Physical Findings: AIMS: Facial and Oral Movements Muscles of Facial Expression: None, normal Lips and Perioral Area: None, normal Jaw: None, normal Tongue: None, normal,Extremity Movements Upper (arms, wrists, hands, fingers): None, normal Lower (legs, knees, ankles, toes): None, normal, Trunk Movements Neck, shoulders, hips:  None, normal, Overall Severity Severity of abnormal movements (highest score from questions above): None, normal Incapacitation due to abnormal movements: None, normal Patient's awareness of abnormal movements (rate only patient's report): No Awareness, Dental Status Current problems with teeth and/or dentures?: No Does patient usually wear dentures?: No  CIWA:  CIWA-Ar Total: 1 COWS:  COWS Total Score: 3  Musculoskeletal: Strength & Muscle Tone: within normal limits Gait & Station: normal Patient leans: N/A  Psychiatric Specialty Exam:  Presentation  General Appearance:   Appropriate for Environment   Eye Contact:  Good  Speech:  Clear and Coherent; Normal Rate  Speech Volume:  Decreased  Handedness: Right    Mood and Affect  Mood:  Depressed  Affect:   Congruent; Appropriate; Restricted   Thought Process  Thought Processes:  Coherent; Goal Directed; Linear  Descriptions of Associations: Intact  Orientation: Full (Time, Place and Person)  Thought Content: Abstract Reasoning; Logical  History of Schizophrenia/Schizoaffective disorder: No  Duration of Psychotic Symptoms: No data recorded Hallucinations: Hallucinations: None  Ideas of Reference: None  Suicidal Thoughts: Suicidal Thoughts: No  Homicidal Thoughts: Homicidal Thoughts: No    Sensorium  Memory: Remote Good; Immediate Fair; Recent Fair   Judgment:  Fair   Insight:  Fair    Art therapist  Concentration:  Good  Attention Span:  Good  Recall:  Good  Fund of Knowledge:  Good  Language:  Good  Psychomotor Activity  Psychomotor Activity: Psychomotor Activity: Normal   Physical Exam: Physical Exam Vitals and nursing note reviewed.  Constitutional:      General: He is not in acute distress.    Appearance: Normal appearance. He is not ill-appearing, toxic-appearing or diaphoretic.  HENT:     Head: Normocephalic and atraumatic.  Pulmonary:     Effort: Pulmonary effort is normal.  Musculoskeletal:        General: Deformity present.  Skin:    General: Skin is warm and dry.  Neurological:     Mental Status: He is oriented to person, place, and time.  Psychiatric:        Mood and Affect: Mood normal.        Behavior: Behavior normal.        Thought Content: Thought content normal.        Judgment: Judgment normal.     Review of Systems  Constitutional:  Negative for chills, fever, malaise/fatigue and weight loss.  Respiratory:  Negative for cough.   Cardiovascular:  Negative for chest pain.  Neurological:  Negative for tremors.  Psychiatric/Behavioral:  Positive for substance abuse. Negative for depression and suicidal ideas. The patient is not nervous/anxious and does not have insomnia.     Blood pressure 103/70, pulse  83, temperature (!) 97.4 F (36.3 C), temperature source Oral, resp. rate 16, height 6' (1.829 m), weight 89.8 kg, SpO2 97%. Body mass index is 26.85 kg/m.  ASSESSMENT:  Miguel Porter is a 57 year old man with a past medical history significant for substance use disorder (opioids), possible Bipolar Disorder, stimulant use disorder (rx amphetamines), substance use disorder (benzodiazepines), past history of trauma who presented to the [facility] ED on 7/22 under IVC taken out by his romantic partner for hallucinations and bizarre behavior. Patient is currently under IVC. He was brought to the [facility] by police on 7/23.    7/27: Miguel Porter is improved relative to yesterday. He has been sleepy this morning due to a poor night of sleep. Spoke with him today for approximately  15 minutes and he was clear, coherent, and raised no concerns. He is still having some trouble sleeping, which we can improve by changing the timing of his medications and increasing the dosage of seroquel and practicing better sleep hygiene. His HbA1c came back at 6.3%, which is pre-diabetic, so will also start him on daily metformin for his outpatient regimen.  He would be appropriate for transitioning towards discharge Sunday.    Diagnoses / Active Problems: -- Bipolar disorder -- Substance-induced psychotic disorder -- Benzodiazepine use disorder -- Pre-diabetes   PLAN: Safety and Monitoring:             -- involuntary admission to inpatient psychiatric unit for safety, stabilization and treatment             -- Daily contact with patient to assess and evaluate symptoms and progress in treatment             -- Patient's case to be discussed in multi-disciplinary team meeting             -- Observation Level : q15 minute checks             -- Vital signs:  q12 hours             -- Precautions: suicide, elopement, and assault   2. Psychiatric Diagnoses and Treatment:              -- Change and increase to Seroquel XR  150 mg daily with dinner (1800) for bipolar disorder             -- Melatonin 3 mg (before bed) for sleep  -- Metformin 500 mg daily for pre-diabetes, antipsychotic-associated weight gain   The risks/benefits/side-effects/alternatives to this medication were discussed in detail with the patient and time was given for questions. The patient consents to medication trial.              -- Metabolic profile and EKG monitoring obtained while on an atypical antipsychotic (BMI: 26.85 Lipid Panel: elevated LDL 04/08/23 HbgA1c: 6.3% QTc: 432 7/22)              -- Encouraged patient to participate in unit milieu and in scheduled group therapies              -- Short Term Goals: Ability to identify changes in lifestyle to reduce recurrence of condition will improve and Ability to identify triggers associated with substance abuse/mental health issues will improve             -- Long Term Goals: Improvement in symptoms so as ready for discharge                3. Medical Issues Being Addressed:              Labs review, notable for hyponatremia on admission to ED, hypocalcemia, amphetamines, and benzodiazepines, otherwise unremarkable. Subsequent lipid panel showed elevated LDL and HgbA1c of 6.3%, consistent with metabolic syndrome.               Tobacco Use Disorder             -- Nicotine patch 21mg /24 hours ordered             -- Smoking cessation encouraged   4. Discharge Planning:              -- Social work and case management to assist with discharge planning and identification of hospital follow-up needs prior to discharge             --  Estimated LOS: 5-7 days             -- Discharge Concerns: Need to establish a safety plan; Medication compliance and effectiveness             -- Discharge Goals: Return home with outpatient referrals for mental health follow-up including medication management/psychotherapy   Addendum to fix cosigner. Margaretmary Dys, MD 04/10/2023, 1:35 PM

## 2023-04-10 NOTE — Plan of Care (Signed)
  Problem: Education: Goal: Knowledge of [facility] Education information/materials will improve Outcome: Progressing Goal: Emotional status will improve Outcome: Progressing Goal: Mental status will improve Outcome: Progressing Goal: Verbalization of understanding the information provided will improve Outcome: Progressing   Problem: Activity: Goal: Interest or engagement in activities will improve Outcome: Progressing   

## 2023-04-10 NOTE — Progress Notes (Signed)
D. Pt has been friendly, polite during interactions- Pt has been visible in the milieu at times throughout the shift. Pt denies withdrawal symptoms, SI/HI and A/VH, and does not appear to be responding to internal stimuli A. Labs and vitals monitored. Pt given and educated on medications. Pt supported emotionally and encouraged to express concerns and ask questions.   R. Pt remains safe with 15 minute checks. Will continue POC.

## 2023-04-10 NOTE — Progress Notes (Signed)
   04/10/23 1100  Psych Admission Type (Psych Patients Only)  Admission Status Involuntary  Psychosocial Assessment  Patient Complaints Substance abuse  Eye Contact Brief  Facial Expression Flat  Affect Appropriate to circumstance  Speech Logical/coherent  Interaction Minimal  Motor Activity Slow  Appearance/Hygiene In scrubs  Behavior Characteristics Cooperative;Appropriate to situation  Mood Depressed  Thought Process  Coherency WDL  Content WDL  Delusions None reported or observed  Perception WDL  Hallucination None reported or observed  Judgment Poor  Confusion None  Danger to Self  Current suicidal ideation? Denies  Danger to Others  Danger to Others None reported or observed

## 2023-04-10 NOTE — [facility] Group Notes (Signed)
[facility] Group Notes:  (Nursing/MHT/Case Management/Adjunct)  Date:  04/10/2023  Time:  2:07 PM  Type of Therapy:  Psychoeducational Skills  Participation Level:  Did Not Attend  Participation Quality:   na  Affect:   na  Cognitive:   na  Insight:  None  Engagement in Group:   na  Modes of Intervention:   na  Summary of Progress/Problems: A podcast from mental health professional Mel Sherral Hammers '' how to help your anxiety '' was played during group. Pt did not attend.  Malva Limes 04/10/2023, 2:07 PM

## 2023-04-10 NOTE — [facility] Group Notes (Signed)
[facility] Group Notes:  (Nursing/MHT/Case Management/Adjunct)  Date:  04/10/2023  Time:  9:49 PM  Type of Therapy:   Wrap-up group  Participation Level:  Did Not Attend  Participation Quality:    Affect:    Cognitive:    Insight:    Engagement in Group:    Modes of Intervention:    Summary of Progress/Problems: Pt refused to attend group.  Noah Delaine 04/10/2023, 9:49 PM

## 2023-04-10 NOTE — Progress Notes (Signed)
   04/10/23 2135  Psych Admission Type (Psych Patients Only)  Admission Status Involuntary  Psychosocial Assessment  Patient Complaints Substance abuse  Eye Contact Brief  Facial Expression Flat  Affect Appropriate to circumstance  Speech Logical/coherent  Interaction Minimal  Motor Activity Slow  Appearance/Hygiene In scrubs  Behavior Characteristics Appropriate to situation  Mood Depressed  Thought Process  Coherency WDL  Content WDL  Delusions None reported or observed  Perception WDL  Hallucination None reported or observed  Judgment Impaired  Confusion None  Danger to Self  Current suicidal ideation? Denies  Danger to Others  Danger to Others None reported or observed

## 2023-04-10 NOTE — [facility] Group Notes (Signed)
Pt did not attend am Goals/Community meeting group.

## 2023-04-11 DIAGNOSIS — F3163 Bipolar disorder, current episode mixed, severe, without psychotic features: Principal | ICD-10-CM

## 2023-04-11 MED ORDER — MELATONIN 3 MG PO TABS: 3.0000 mg | ORAL_TABLET | Freq: Every day | ORAL | 0 refills | Status: AC

## 2023-04-11 MED ORDER — HYDROXYZINE HCL 25 MG PO TABS: 25.0000 mg | ORAL_TABLET | Freq: Three times a day (TID) | ORAL | 0 refills | Status: AC | PRN

## 2023-04-11 MED ORDER — QUETIAPINE FUMARATE ER 150 MG PO TB24: 150.0000 mg | ORAL_TABLET | Freq: Every day | ORAL | 0 refills | Status: AC

## 2023-04-11 MED ORDER — METFORMIN HCL ER 500 MG PO TB24: 500.0000 mg | ORAL_TABLET | Freq: Every day | ORAL | 0 refills | Status: AC

## 2023-04-11 NOTE — Discharge Instructions (Signed)

## 2023-04-11 NOTE — [facility] Suicide Risk Assessment (Addendum)
Suicide Risk Assessment  Discharge Assessment    [facility] Discharge Suicide Risk Assessment   Principal Problem: Bipolar disorder, manic (HCC) Discharge Diagnoses: Principal Problem:   Bipolar disorder, manic (HCC) Active Problems:   Psychoactive substance-induced psychosis (HCC)   Benzodiazepine abuse (HCC)   Pre-diabetes   Bipolar 1 disorder, mixed, severe [facility])  Hospital course: Miguel Porter is a 57 year old male with past history of opiate use disorder, stimulant disorder, benzodiazepine use disorder, possible bipolar disorder who presented following hallucinations and bizarre behavior.  Diagnoses admission was substance-induced psychotic disorder and rule out bipolar disorder.  He was started on a benzodiazepine withdrawal protocol and opioid withdrawal protocol with low CIWA and COWS scores during hospitalization.  He had no symptoms of withdrawal on day of discharge.  For psychosis, he was started on Seroquel 100 and titrated to 150.  He is experiencing no symptoms on day of discharge.  He was also diagnosed with prediabetes and started on metformin.  Substance use still appears to be the trigger for this psychotic episode but should still be monitored for any bipolar disorder symptoms and outpatient.  Seroquel indication at discharge is for bipolar depression.  During the patient's hospitalization, patient had extensive initial psychiatric evaluation, and follow-up psychiatric evaluations every day.  Psychiatric diagnoses provided upon initial assessment: Substance induced psychotic disorder, rule out bipolar disorder, opioid withdrawal, benzodiazepine withdrawal  Patient's psychiatric medications were adjusted on admission: Started Seroquel 100 mg once at night for insomnia, psychosis.  Started benzodiazepine withdrawal protocol.  Started opiate withdrawal protocol.  During the hospitalization, other adjustments were made to the patient's psychiatric medication regimen: Started melatonin 3  mg once at night for sleep.  Change Seroquel from 100 to 250 mg and frequency to once daily with dinner for bipolar depression.  Patient's care was discussed during the interdisciplinary team meeting every day during the hospitalization.  The patient is not having side effects to prescribed psychiatric medication.  Gradually, patient started adjusting to milieu. The patient was evaluated each day by a clinical provider to ascertain response to treatment. Improvement was noted by the patient's report of decreasing symptoms, improved sleep and appetite, affect, medication tolerance, behavior, and participation in unit programming.  Patient was asked each day to complete a self inventory noting mood, mental status, pain, new symptoms, anxiety and concerns.   Symptoms were reported as significantly decreased or resolved completely by discharge.  The patient reports that their mood is stable.  The patient denied having suicidal thoughts for more than 48 hours prior to discharge.  Patient denies having homicidal thoughts.  Patient denies having auditory hallucinations.  Patient denies any visual hallucinations or other symptoms of psychosis.  The patient was motivated to continue taking medication with a goal of continued improvement in mental health.   The patient reports their target psychiatric symptoms of hallucinations and disorganized behavior responded well to the psychiatric medications, and the patient reports overall benefit other psychiatric hospitalization. Supportive psychotherapy was provided to the patient. The patient also participated in regular group therapy while hospitalized. Coping skills, problem solving as well as relaxation therapies were also part of the unit programming.  Labs were reviewed with the patient, and abnormal results were discussed with the patient.  The patient is able to verbalize their individual safety plan to this provider.  # It is recommended to the patient to  continue psychiatric medications as prescribed, after discharge from the hospital.    # It is recommended to the patient to follow up  with your outpatient psychiatric provider and PCP.  # It was discussed with the patient, the impact of alcohol, drugs, tobacco have been there overall psychiatric and medical wellbeing, and total abstinence from substance use was recommended the patient.ed.  # Prescriptions provided or sent directly to preferred pharmacy at discharge. Patient agreeable to plan. Given opportunity to ask questions. Appears to feel comfortable with discharge.    # In the event of worsening symptoms, the patient is instructed to call the crisis hotline, 911 and or go to the nearest ED for appropriate evaluation and treatment of symptoms. To follow-up with primary care provider for other medical issues, concerns and or health care needs  # Patient was discharged home with her partner with a plan to follow up as noted below.   On day of discharge he reports no concerns.  He reports no symptoms of benzodiazepine withdrawal including diaphoresis, nausea, vomiting, tremor.  He is not seeing or hearing things that others cannot.  He reports no paranoia that others are conspiring against him, spying on him, or trying harm in any way.  Denies HI, SI.  He reports that he slept well and that he is eating well.  He reports that the plan is to discharge at 1230 with his partner who will pick him up.  We discussed sleep hygiene and he was given a chart to take on.    Total Time spent with patient: 15 minutes  Musculoskeletal: Strength & Muscle Tone: within normal limits Gait & Station: normal Patient leans: N/A  Psychiatric Specialty Exam  Presentation  General Appearance:  Appropriate for Environment  Eye Contact: Good  Speech: Normal Rate  Speech Volume: Normal  Handedness: Right   Mood and Affect  Mood: Euthymic  Duration of Depression Symptoms: No data  recorded Affect: Appropriate; Congruent   Thought Process  Thought Processes: Coherent  Descriptions of Associations:Intact  Orientation:Full (Time, Place and Person)  Thought Content:Logical  History of Schizophrenia/Schizoaffective disorder:No  Duration of Psychotic Symptoms:No data recorded Hallucinations:Hallucinations: None  Ideas of Reference:None  Suicidal Thoughts:Suicidal Thoughts: No  Homicidal Thoughts:Homicidal Thoughts: No   Sensorium  Memory: Immediate Good; Recent Good; Remote Good  Judgment: Good  Insight: Good   Executive Functions  Concentration: Good  Attention Span: Good  Recall: Good  Fund of Knowledge: Good  Language: Good   Psychomotor Activity  Psychomotor Activity: Psychomotor Activity: Normal   Assets  Assets: Desire for Improvement; Housing; Social Support   Sleep  Sleep: Sleep: Good Number of Hours of Sleep: 8   Physical Exam: Physical Exam Vitals and nursing note reviewed.  HENT:     Head: Normocephalic and atraumatic.  Pulmonary:     Effort: Pulmonary effort is normal.  Neurological:     General: No focal deficit present.     Mental Status: He is alert.    Review of Systems  Constitutional:  Negative for fever.  Cardiovascular:  Negative for chest pain and palpitations.  Gastrointestinal:  Negative for constipation, diarrhea, nausea and vomiting.  Neurological:  Negative for dizziness, weakness and headaches.   Blood pressure 121/77, pulse (!) 114, temperature 98.2 F (36.8 C), temperature source Oral, resp. rate 16, height 6' (1.829 m), weight 89.8 kg, SpO2 100%. Body mass index is 26.85 kg/m.  Mental Status Per Nursing Assessment::   On Admission:  NA  Demographic Factors:  Male and Caucasian  Loss Factors: Decrease in vocational status and Decline in physical health  Historical Factors: Family history of mental illness or  substance abuse  Risk Reduction Factors:   Sense of  responsibility to family, Living with another person, especially a relative, and Positive social support  Continued Clinical Symptoms:  Alcohol/Substance Abuse/Dependencies No current symptoms from psychiatric conditions.   Cognitive Features That Contribute To Risk:  None    Suicide Risk:  Mild:  There are no identifiable suicide plans, no associated intent, mild dysphoria and related symptoms, good self-control (both objective and subjective assessment), few other risk factors, and identifiable protective factors, including available and accessible social support.    Follow-up Information     Services, [facility]. Go on 04/14/2023.   Why: You have a hospital follow up appointment for therapy and medication management services on 04/14/23 at 9:00 am.  This appointment will be held in person.  Please bring photo ID, insurance card and proof of any income. Contact information: [address] [place] [address] [phone]                 Plan Of Care/Follow-up recommendations:  Activity: as tolerated  Diet: heart healthy  Other: -Follow-up with your outpatient psychiatric provider -instructions on appointment date, time, and address (location) are provided to you in discharge paperwork.  -Take your psychiatric medications as prescribed at discharge - instructions are provided to you in the discharge paperwork  -Follow-up with outpatient primary care doctor and other specialists -for management of chronic medical disease, including: Rheumatoid arthritis  -Testing: Follow-up with outpatient provider for abnormal lab results: Elevated A1c at 6.3.   -Recommend abstinence from alcohol, tobacco, and other illicit drug use at discharge.   -If your psychiatric symptoms recur, worsen, or if you have side effects to your psychiatric medications, call your outpatient psychiatric provider, 911, [phone] or go to the nearest emergency department.  -If suicidal thoughts recur,  call your outpatient psychiatric provider, 911, [phone] or go to the nearest emergency department.   Meryl Dare, MD 04/11/2023, 10:04 AM

## 2023-04-11 NOTE — Discharge Summary (Signed)
Physician Discharge Summary Note  Patient:  Miguel Porter is an 57 y.o., male MRN:  [id number] DOB:  July 08, 1966 Patient phone:  [phone] (home)  Patient address:   [address] [place] [address],  Total Time spent with patient: 15 minutes  Date of Admission:  04/06/2023 Date of Discharge: 04/11/2023  Reason for Admission: Hallucinations and bizarre behavior  Principal Problem: Bipolar disorder, manic (HCC) Discharge Diagnoses: Principal Problem:   Bipolar disorder, manic (HCC) Active Problems:   Psychoactive substance-induced psychosis (HCC)   Benzodiazepine abuse (HCC)   Pre-diabetes   Bipolar 1 disorder, mixed, severe (HCC)   Past Psychiatric History: opiate use disorder, stimulant disorder, benzodiazepine use disorder, possible bipolar disorder   Past Medical History:  Past Medical History:  Diagnosis Date   Kidney stone    Rheumatoid arthritis (HCC)     Past Surgical History:  Procedure Laterality Date   CHOLECYSTECTOMY  01/01/2012   Procedure: LAPAROSCOPIC CHOLECYSTECTOMY;  Surgeon: Dalia Heading, MD;  Location: [facility];  Service: General;  Laterality: N/A;   LITHOTRIPSY     Family History:  Family History  Problem Relation Age of Onset   Hypertension Father    Heart Problems Father    Family Psychiatric  History: Denies psychiatric conditions or substance use in the family. Social History:  Social History   Substance and Sexual Activity  Alcohol Use Yes   Comment: very little, 6 pack beer/year     Social History   Substance and Sexual Activity  Drug Use Yes   Types: Amphetamines, Benzodiazepines    Social History   Socioeconomic History   Marital status: Legally Separated    Spouse name: Not on file   Number of children: Not on file   Years of education: Not on file   Highest education level: Not on file  Occupational History   Not on file  Tobacco Use   Smoking status: Former    Current packs/day: 0.00    Average  packs/day: 1 pack/day for 28.0 years (28.0 ttl pk-yrs)    Types: Cigarettes    Start date: 11/1991    Quit date: 11/2019    Years since quitting: 3.4   Smokeless tobacco: Never  Vaping Use   Vaping status: Every Day   Substances: Nicotine  Substance and Sexual Activity   Alcohol use: Yes    Comment: very little, 6 pack beer/year   Drug use: Yes    Types: Amphetamines, Benzodiazepines   Sexual activity: Not on file  Other Topics Concern   Not on file  Social History Narrative   Not on file   Social Determinants of Health   Financial Resource Strain: Not on file  Food Insecurity: Patient Declined (04/06/2023)   Hunger Vital Sign    Worried About Running Out of Food in the Last Year: Patient declined    Ran Out of Food in the Last Year: Patient declined  Transportation Needs: Patient Declined (04/06/2023)   PRAPARE - Administrator, Civil Service (Medical): Patient declined    Lack of Transportation (Non-Medical): Patient declined  Physical Activity: Not on file  Stress: Not on file  Social Connections: Not on file    Hospital Course:   Miguel Porter is a 57 year old male with past history of opiate use disorder, stimulant disorder, benzodiazepine use disorder, possible bipolar disorder who presented following hallucinations and bizarre behavior.  Diagnoses admission was substance-induced psychotic disorder and rule out bipolar disorder.  He was started  on a benzodiazepine withdrawal protocol and opioid withdrawal protocol with low CIWA and COWS scores during hospitalization.  He had no symptoms of withdrawal on day of discharge.  For psychosis, he was started on Seroquel 100 and titrated to 150.  He is experiencing no symptoms on day of discharge.  He was also diagnosed with prediabetes and started on metformin.  Substance use still appears to be the trigger for this psychotic episode but should still be monitored for any bipolar disorder symptoms and outpatient.  Seroquel  indication at discharge is for bipolar depression.   During the patient's hospitalization, patient had extensive initial psychiatric evaluation, and follow-up psychiatric evaluations every day.   Psychiatric diagnoses provided upon initial assessment: Substance induced psychotic disorder, rule out bipolar disorder, opioid withdrawal, benzodiazepine withdrawal   Patient's psychiatric medications were adjusted on admission: Started Seroquel 100 mg once at night for insomnia, psychosis.  Started benzodiazepine withdrawal protocol.  Started opiate withdrawal protocol.   During the hospitalization, other adjustments were made to the patient's psychiatric medication regimen: Started melatonin 3 mg once at night for sleep.  Change Seroquel from 100 to 250 mg and frequency to once daily with dinner for bipolar depression.   Patient's care was discussed during the interdisciplinary team meeting every day during the hospitalization.   The patient is not having side effects to prescribed psychiatric medication.   Gradually, patient started adjusting to milieu. The patient was evaluated each day by a clinical provider to ascertain response to treatment. Improvement was noted by the patient's report of decreasing symptoms, improved sleep and appetite, affect, medication tolerance, behavior, and participation in unit programming.  Patient was asked each day to complete a self inventory noting mood, mental status, pain, new symptoms, anxiety and concerns.   Symptoms were reported as significantly decreased or resolved completely by discharge.  The patient reports that their mood is stable.  The patient denied having suicidal thoughts for more than 48 hours prior to discharge.  Patient denies having homicidal thoughts.  Patient denies having auditory hallucinations.  Patient denies any visual hallucinations or other symptoms of psychosis.  The patient was motivated to continue taking medication with a goal of  continued improvement in mental health.    The patient reports their target psychiatric symptoms of hallucinations and disorganized behavior responded well to the psychiatric medications, and the patient reports overall benefit other psychiatric hospitalization. Supportive psychotherapy was provided to the patient. The patient also participated in regular group therapy while hospitalized. Coping skills, problem solving as well as relaxation therapies were also part of the unit programming.   Labs were reviewed with the patient, and abnormal results were discussed with the patient.   The patient is able to verbalize their individual safety plan to this provider.   # It is recommended to the patient to continue psychiatric medications as prescribed, after discharge from the hospital.     # It is recommended to the patient to follow up with your outpatient psychiatric provider and PCP.   # It was discussed with the patient, the impact of alcohol, drugs, tobacco have been there overall psychiatric and medical wellbeing, and total abstinence from substance use was recommended the patient.ed.   # Prescriptions provided or sent directly to preferred pharmacy at discharge. Patient agreeable to plan. Given opportunity to ask questions. Appears to feel comfortable with discharge.    # In the event of worsening symptoms, the patient is instructed to call the crisis hotline, 911 and or go  to the nearest ED for appropriate evaluation and treatment of symptoms. To follow-up with primary care provider for other medical issues, concerns and or health care needs   # Patient was discharged home with her partner with a plan to follow up as noted below.     On day of discharge he reports no concerns.  He reports no symptoms of benzodiazepine withdrawal including diaphoresis, nausea, vomiting, tremor.  He is not seeing or hearing things that others cannot.  He reports no paranoia that others are conspiring against  him, spying on him, or trying harm in any way.  Denies HI, SI.  He reports that he slept well and that he is eating well.  He reports that the plan is to discharge at 1230 with his partner who will pick him up.  We discussed sleep hygiene and he was given a chart to take on.     Physical Findings: AIMS: Facial and Oral Movements Muscles of Facial Expression: None, normal Lips and Perioral Area: None, normal Jaw: None, normal Tongue: None, normal,Extremity Movements Upper (arms, wrists, hands, fingers): None, normal Lower (legs, knees, ankles, toes): None, normal, Trunk Movements Neck, shoulders, hips: None, normal, Overall Severity Severity of abnormal movements (highest score from questions above): None, normal Incapacitation due to abnormal movements: None, normal Patient's awareness of abnormal movements (rate only patient's report): No Awareness, Dental Status Current problems with teeth and/or dentures?: No Does patient usually wear dentures?: No  CIWA:  CIWA-Ar Total: 2 COWS:  COWS Total Score: 3  Musculoskeletal: Strength & Muscle Tone: within normal limits Gait & Station: normal Patient leans: N/A   Psychiatric Specialty Exam:  Presentation  General Appearance:  Appropriate for Environment  Eye Contact: Good  Speech: Normal Rate  Speech Volume: Normal  Handedness: Right   Mood and Affect  Mood: Euthymic  Affect: Appropriate; Congruent   Thought Process  Thought Processes: Coherent  Descriptions of Associations:Intact  Orientation:Full (Time, Place and Person)  Thought Content:Logical  History of Schizophrenia/Schizoaffective disorder:No  Duration of Psychotic Symptoms:No data recorded Hallucinations:Hallucinations: None  Ideas of Reference:None  Suicidal Thoughts:Suicidal Thoughts: No  Homicidal Thoughts:Homicidal Thoughts: No   Sensorium  Memory: Immediate Good; Recent Good; Remote  Good  Judgment: Good  Insight: Good   Executive Functions  Concentration: Good  Attention Span: Good  Recall: Good  Fund of Knowledge: Good  Language: Good   Psychomotor Activity  Psychomotor Activity: Psychomotor Activity: Normal   Assets  Assets: Desire for Improvement; Housing; Social Support   Sleep  Sleep: Sleep: Good Number of Hours of Sleep: 8    Physical Exam: Physical Exam Vitals and nursing note reviewed.  HENT:     Head: Normocephalic and atraumatic.  Pulmonary:     Effort: Pulmonary effort is normal.  Neurological:     General: No focal deficit present.     Mental Status: He is alert.    Review of Systems  Constitutional:  Negative for fever.  Cardiovascular:  Negative for chest pain and palpitations.  Gastrointestinal:  Negative for constipation, diarrhea, nausea and vomiting.  Neurological:  Negative for dizziness, weakness and headaches.   Blood pressure 121/77, pulse (!) 114, temperature 98.2 F (36.8 C), temperature source Oral, resp. rate 16, height 6' (1.829 m), weight 89.8 kg, SpO2 100%. Body mass index is 26.85 kg/m.   Social History   Tobacco Use  Smoking Status Former   Current packs/day: 0.00   Average packs/day: 1 pack/day for 28.0 years (28.0 ttl pk-yrs)  Types: Cigarettes   Start date: 11/1991   Quit date: 11/2019   Years since quitting: 3.4  Smokeless Tobacco Never   Tobacco Cessation:  N/A, patient does not currently use tobacco products   Blood Alcohol level:  Lab Results  Component Value Date   ETH <10 04/05/2023    Metabolic Disorder Labs:  Lab Results  Component Value Date   HGBA1C 6.3 (H) 04/08/2023   MPG 134.11 04/08/2023   No results found for: "PROLACTIN" Lab Results  Component Value Date   CHOL 166 04/08/2023   TRIG 40 04/08/2023   HDL 53 04/08/2023   CHOLHDL 3.1 04/08/2023   VLDL 8 04/08/2023   LDLCALC 105 (H) 04/08/2023    See Psychiatric Specialty Exam and Suicide Risk  Assessment completed by Attending Physician prior to discharge.  Discharge destination:  Home  Is patient on multiple antipsychotic therapies at discharge:  No   Has Patient had three or more failed trials of antipsychotic monotherapy by history:  No  Recommended Plan for Multiple Antipsychotic Therapies: NA  Discharge Instructions     Diet - low sodium heart healthy   Complete by: As directed    Increase activity slowly   Complete by: As directed       Allergies as of 04/11/2023   No Known Allergies      Medication List     TAKE these medications      Indication  hydroxychloroquine 200 MG tablet Commonly known as: Plaquenil Take 1 tablet (200 mg total) by mouth daily.  Indication: Rheumatoid Arthritis   hydrOXYzine 25 MG tablet Commonly known as: ATARAX Take 1 tablet (25 mg total) by mouth 3 (three) times daily as needed for anxiety.  Indication: Feeling Anxious   ibuprofen 200 MG tablet Commonly known as: ADVIL Take 600 mg by mouth every 6 (six) hours as needed for pain.  Indication: Pain   melatonin 3 MG Tabs tablet Take 1 tablet (3 mg total) by mouth at bedtime.  Indication: Trouble Sleeping   metFORMIN 500 MG 24 hr tablet Commonly known as: GLUCOPHAGE-XR Take 1 tablet (500 mg total) by mouth daily with breakfast. Start taking on: April 12, 2023  Indication: Body Weight Gain due to Antipsychotic Medication Use, OBESITY   QUEtiapine Fumarate 150 MG 24 hr tablet Commonly known as: SEROQUEL XR Take 1 tablet (150 mg total) by mouth daily after supper.  Indication: Manic-Depression, Trouble Sleeping        Follow-up Information     [facility]. Go on 04/14/2023.   Why: You have a hospital follow up appointment for therapy and medication management services on 04/14/23 at 9:00 am.  This appointment will be held in person.  Please bring photo ID, insurance card and proof of any income. Contact information: [address] [place]  [address] [phone]                 Follow-up recommendations:   Activity: as tolerated   Diet: heart healthy   Other: -Follow-up with your outpatient psychiatric provider -instructions on appointment date, time, and address (location) are provided to you in discharge paperwork.   -Take your psychiatric medications as prescribed at discharge - instructions are provided to you in the discharge paperwork   -Follow-up with outpatient primary care doctor and other specialists -for management of chronic medical disease, including: Rheumatoid arthritis   -Testing: Follow-up with outpatient provider for abnormal lab results: Elevated A1c at 6.3.    -Recommend abstinence from alcohol,  tobacco, and other illicit drug use at discharge.    -If your psychiatric symptoms recur, worsen, or if you have side effects to your psychiatric medications, call your outpatient psychiatric provider, 911, [phone] or go to the nearest emergency department.   -If suicidal thoughts recur, call your outpatient psychiatric provider, 911, 988 or go to the nearest emergency department.  Signed: Meryl Dare, MD 04/11/2023, 10:08 AM

## 2023-04-11 NOTE — Progress Notes (Signed)
  [facility] Adult Case Management Discharge Plan :  Will you be returning to the same living situation after discharge:  Yes, with fiance At discharge, do you have transportation home?: Yes, fiance will pick him up Do you have the ability to pay for your medications: Yes, through [facility]  Release of information consent forms completed and in the chart;  Patient's signature needed at discharge.  Patient to Follow up at:  Follow-up Information     [facility]. Go on 04/14/2023.   Why: You have a hospital follow up appointment for therapy and medication management services on 04/14/23 at 9:00 am.  This appointment will be held in person.  Please bring photo ID, insurance card and proof of any income. Contact information: [address] [place] [address] [phone]                 Next level of care provider has access to [facility] Link:no  Safety Planning and Suicide Prevention discussed: Yes, with fiance     Has patient been referred to the Quitline?: Patient refused referral for treatment  Patient has been referred for addiction treatment: No known substance use disorder.  Lorri Frederick, LCSW 04/11/2023, 9:28 AM

## 2023-04-11 NOTE — Plan of Care (Signed)
  Problem: Education: Goal: Mental status will improve Outcome: Progressing   Problem: Activity: Goal: Sleeping patterns will improve Outcome: Progressing   

## 2023-04-11 NOTE — Progress Notes (Signed)
Pt discharged to lobby- girlfriend present.Pt was stable and appreciative at that time. All papers and electronic prescriptions were given and valuables returned. Suicide safety plan completed. Verbal understanding expressed. Denies SI/HI and A/VH. Pt given opportunity to express concerns and ask questions.

## 2023-04-11 NOTE — Progress Notes (Signed)
   04/11/23 1100  Psych Admission Type (Psych Patients Only)  Admission Status Involuntary  Psychosocial Assessment  Patient Complaints Anxiety  Eye Contact Brief  Facial Expression Anxious  Affect Appropriate to circumstance  Speech Logical/coherent  Interaction Assertive  Motor Activity Slow  Appearance/Hygiene Unremarkable  Behavior Characteristics Cooperative;Appropriate to situation  Mood Anxious;Pleasant  Thought Process  Coherency WDL  Content WDL  Delusions None reported or observed  Perception WDL  Hallucination None reported or observed  Judgment Impaired  Confusion None  Danger to Self  Current suicidal ideation? Denies  Danger to Others  Danger to Others None reported or observed

## 2023-05-20 ENCOUNTER — Emergency Department (HOSPITAL_COMMUNITY)
Admission: EM | Admit: 2023-05-20 | Discharge: 2023-05-20 | Disposition: A | Discharge status: Home / Self Care | Payer: MEDICAID | Attending: Emergency Medicine | Admitting: Emergency Medicine

## 2023-05-20 ENCOUNTER — Encounter (HOSPITAL_COMMUNITY): Payer: Self-pay | Admitting: Emergency Medicine

## 2023-05-20 ENCOUNTER — Other Ambulatory Visit: Payer: Self-pay

## 2023-05-20 DIAGNOSIS — Z1330 Encounter for screening examination for mental health and behavioral disorders, unspecified: Secondary | ICD-10-CM | POA: Diagnosis present

## 2023-05-20 NOTE — ED Triage Notes (Signed)
Pt BIB [facility] with reports of being IVC'd by his girlfriend. Per police who spoke with girlfriend on scene that took out the IVC papers reports that she admitted to lying and "took out the IVC papers out of spite." Per Police who states the girlfriend stated "I did this out of spite." Pt denies SI/HI. Denies auditory or visual hallucinations.

## 2023-05-20 NOTE — ED Notes (Signed)
IVC rescinded. Paperwork faxed to [employer]

## 2023-05-20 NOTE — Discharge Instructions (Signed)
Pressure slightly high today.  Please have this rechecked by your primary care doctor.  Please continue taking your psychiatric medications as prescribed and follow-up with your psychiatrist.  Come back to the ER if you have new or worsening symptoms.

## 2023-05-20 NOTE — ED Provider Notes (Signed)
[place] EMERGENCY DEPARTMENT AT [facility] Provider Note   CSN: [id number] Arrival date & time: 05/20/23  1355     History  Chief Complaint  Patient presents with   V70.1    Miguel Porter is a 57 y.o. male.  Bipolar type I.  Presents to the ER today on IVC, taken out by his ex-girlfriend who is his current roommate stating he has been having hallucinations, stating he is seeing angels and stating his girlfriend is ED from the [employer] and he needs to denies to be with her.  She had told police were present at bedside that she just took it out of spite because she is upset about the relationship and patient had presented to place with a long letter written to the patient by her at this morning about how she is upset that he no longer wants to be relationship with her.  Patient is calm and cooperative, denies SI or HI, denies hallucinations, has been taking his Seroquel, has been quiet except for did not take his dose yet today.  Denies hallucinations.  HPI     Home Medications Prior to Admission medications   Medication Sig Start Date End Date Taking? Authorizing Provider  hydroxychloroquine (PLAQUENIL) 200 MG tablet Take 1 tablet (200 mg total) by mouth daily. Patient not taking: Reported on 04/06/2023 06/01/22   Fuller Plan, MD  hydrOXYzine (ATARAX) 25 MG tablet Take 1 tablet (25 mg total) by mouth 3 (three) times daily as needed for anxiety. 04/11/23   Sarita Bottom, MD  ibuprofen (ADVIL,MOTRIN) 200 MG tablet Take 600 mg by mouth every 6 (six) hours as needed for pain.    [provider]  melatonin 3 MG TABS tablet Take 1 tablet (3 mg total) by mouth at bedtime. 04/11/23   Sarita Bottom, MD  metFORMIN (GLUCOPHAGE-XR) 500 MG 24 hr tablet Take 1 tablet (500 mg total) by mouth daily with breakfast. 04/12/23   Sarita Bottom, MD  QUEtiapine (SEROQUEL XR) 150 MG 24 hr tablet Take 1 tablet (150 mg total) by mouth daily after supper. 04/11/23   Sarita Bottom, MD       Allergies    Patient has no known allergies.    Review of Systems   Review of Systems  Physical Exam Updated Vital Signs BP (!) 143/86 (BP Location: Right Arm)   Pulse (!) 113   Temp 98 F (36.7 C) (Oral)   Resp 19   SpO2 98%  Physical Exam  ED Results / Procedures / Treatments   Labs (all labs ordered are listed, but only abnormal results are displayed) Labs Reviewed - No data to display  EKG None  Radiology No results found.  Procedures Procedures    Medications Ordered in ED Medications - No data to display  ED Course/ Medical Decision Making/ A&P                                 Medical Decision Making Ddx: Bipolar disorder, mania, suicidal thoughts, delusions, other ED course: Patient presents the ER on IVC, girlfriend had told the officers that she took this out out of spite and then tried to take it back.  Her complaints were that he was saying he was seeing angels and having some religious preoccupation and not taking his medications.  Patient is very calm, cooperative, not suicidal or homicidal, do not feel he meets arteria for IVC, especially given  the girlfriend spoke with the officer again in the ED on the phone and says she was actually acting on PAP for the patient's family and her story was not consistent at all with the patient's presentation is very calm           Final Clinical Impression(s) / ED Diagnoses Final diagnoses:  Encounter for screening examination for mental health and behavioral disorders    Rx / DC Orders ED Discharge Orders     None         Josem Kaufmann 05/20/23 1933    Eber Hong, MD 05/23/23 1227
